# Patient Record
Sex: Female | Born: 1960 | Race: White | Hispanic: No | Marital: Married | Smoking: Current every day smoker
Health system: Southern US, Community
[De-identification: ages and names within clinical notes are randomized; demographics above are authoritative.]

## PROBLEM LIST (undated history)

## (undated) DIAGNOSIS — Z8619 Personal history of other infectious and parasitic diseases: Secondary | ICD-10-CM

## (undated) DIAGNOSIS — E559 Vitamin D deficiency, unspecified: Secondary | ICD-10-CM

## (undated) HISTORY — DX: Vitamin D deficiency, unspecified: E55.9

## (undated) HISTORY — DX: Personal history of other infectious and parasitic diseases: Z86.19

## (undated) HISTORY — PX: TUBAL LIGATION: SHX77

---

## 2005-10-31 ENCOUNTER — Ambulatory Visit: Payer: Self-pay

## 2006-01-07 ENCOUNTER — Ambulatory Visit: Payer: Self-pay

## 2007-11-04 ENCOUNTER — Ambulatory Visit: Payer: Self-pay

## 2010-06-12 LAB — HM PAP SMEAR: HM PAP: NEGATIVE

## 2010-06-21 ENCOUNTER — Ambulatory Visit: Payer: Self-pay | Admitting: Family Medicine

## 2010-07-10 ENCOUNTER — Ambulatory Visit: Payer: Self-pay | Admitting: Family Medicine

## 2013-06-30 ENCOUNTER — Ambulatory Visit: Payer: Self-pay | Admitting: Family Medicine

## 2013-07-01 LAB — HM MAMMOGRAPHY

## 2014-04-22 LAB — CBC AND DIFFERENTIAL
HCT: 43 % (ref 36–46)
Hemoglobin: 15.1 g/dL (ref 12.0–16.0)
PLATELETS: 297 10*3/uL (ref 150–399)
WBC: 13.3 10^3/mL

## 2014-07-21 LAB — LIPID PANEL
CHOLESTEROL: 138 mg/dL (ref 0–200)
HDL: 52 mg/dL (ref 35–70)
LDL Cholesterol: 70 mg/dL
Triglycerides: 82 mg/dL (ref 40–160)

## 2014-07-21 LAB — BASIC METABOLIC PANEL
BUN: 10 mg/dL (ref 4–21)
Creatinine: 0.8 mg/dL (ref 0.5–1.1)
GLUCOSE: 93 mg/dL
POTASSIUM: 3.9 mmol/L (ref 3.4–5.3)
Sodium: 141 mmol/L (ref 137–147)

## 2015-09-13 DIAGNOSIS — E559 Vitamin D deficiency, unspecified: Secondary | ICD-10-CM | POA: Insufficient documentation

## 2015-09-13 DIAGNOSIS — F17200 Nicotine dependence, unspecified, uncomplicated: Secondary | ICD-10-CM | POA: Insufficient documentation

## 2015-09-13 DIAGNOSIS — F439 Reaction to severe stress, unspecified: Secondary | ICD-10-CM | POA: Insufficient documentation

## 2015-09-13 DIAGNOSIS — M67439 Ganglion, unspecified wrist: Secondary | ICD-10-CM | POA: Insufficient documentation

## 2015-09-14 ENCOUNTER — Encounter: Payer: Self-pay | Admitting: Family Medicine

## 2015-09-14 ENCOUNTER — Ambulatory Visit (INDEPENDENT_AMBULATORY_CARE_PROVIDER_SITE_OTHER): Payer: 59 | Admitting: Family Medicine

## 2015-09-14 VITALS — BP 140/86 | HR 70 | Temp 98.2°F | Resp 16 | Ht 61.5 in | Wt 139.0 lb

## 2015-09-14 DIAGNOSIS — Z124 Encounter for screening for malignant neoplasm of cervix: Secondary | ICD-10-CM | POA: Diagnosis not present

## 2015-09-14 DIAGNOSIS — Z1239 Encounter for other screening for malignant neoplasm of breast: Secondary | ICD-10-CM | POA: Diagnosis not present

## 2015-09-14 DIAGNOSIS — E559 Vitamin D deficiency, unspecified: Secondary | ICD-10-CM | POA: Diagnosis not present

## 2015-09-14 DIAGNOSIS — R200 Anesthesia of skin: Secondary | ICD-10-CM

## 2015-09-14 DIAGNOSIS — R202 Paresthesia of skin: Secondary | ICD-10-CM

## 2015-09-14 DIAGNOSIS — Z720 Tobacco use: Secondary | ICD-10-CM

## 2015-09-14 DIAGNOSIS — F17200 Nicotine dependence, unspecified, uncomplicated: Secondary | ICD-10-CM

## 2015-09-14 DIAGNOSIS — Z01419 Encounter for gynecological examination (general) (routine) without abnormal findings: Secondary | ICD-10-CM | POA: Diagnosis not present

## 2015-09-14 DIAGNOSIS — J309 Allergic rhinitis, unspecified: Secondary | ICD-10-CM

## 2015-09-14 MED ORDER — FLUTICASONE PROPIONATE 50 MCG/ACT NA SUSP: 2.0000 | Freq: Every day | NASAL | Status: DC

## 2015-09-14 NOTE — Patient Instructions (Addendum)
Recommend taking 81mg  Enteric Coated aspirin daily   Smoking Cessation, Tips for Success If you are ready to quit smoking, congratulations! You have chosen to help yourself be healthier. Cigarettes bring nicotine, tar, carbon monoxide, and other irritants into your body. Your lungs, heart, and blood vessels will be able to work better without these poisons. There are many different ways to quit smoking. Nicotine gum, nicotine patches, a nicotine inhaler, or nicotine nasal spray can help with physical craving. Hypnosis, support groups, and medicines help break the habit of smoking. WHAT THINGS CAN I DO TO MAKE QUITTING EASIER?  Here are some tips to help you quit for good:  Pick a date when you will quit smoking completely. Tell all of your friends and family about your plan to quit on that date.  Do not try to slowly cut down on the number of cigarettes you are smoking. Pick a quit date and quit smoking completely starting on that day.  Throw away all cigarettes.   Clean and remove all ashtrays from your home, work, and car.  On a card, write down your reasons for quitting. Carry the card with you and read it when you get the urge to smoke.  Cleanse your body of nicotine. Drink enough water and fluids to keep your urine clear or pale yellow. Do this after quitting to flush the nicotine from your body.  Learn to predict your moods. Do not let a bad situation be your excuse to have a cigarette. Some situations in your life might tempt you into wanting a cigarette.  Never have "just one" cigarette. It leads to wanting another and another. Remind yourself of your decision to quit.  Change habits associated with smoking. If you smoked while driving or when feeling stressed, try other activities to replace smoking. Stand up when drinking your coffee. Brush your teeth after eating. Sit in a different chair when you read the paper. Avoid alcohol while trying to quit, and try to drink fewer  caffeinated beverages. Alcohol and caffeine may urge you to smoke.  Avoid foods and drinks that can trigger a desire to smoke, such as sugary or spicy foods and alcohol.  Ask people who smoke not to smoke around you.  Have something planned to do right after eating or having a cup of coffee. For example, plan to take a walk or exercise.  Try a relaxation exercise to calm you down and decrease your stress. Remember, you may be tense and nervous for the first 2 weeks after you quit, but this will pass.  Find new activities to keep your hands busy. Play with a pen, coin, or rubber band. Doodle or draw things on paper.  Brush your teeth right after eating. This will help cut down on the craving for the taste of tobacco after meals. You can also try mouthwash.   Use oral substitutes in place of cigarettes. Try using lemon drops, carrots, cinnamon sticks, or chewing gum. Keep them handy so they are available when you have the urge to smoke.  When you have the urge to smoke, try deep breathing.  Designate your home as a nonsmoking area.  If you are a heavy smoker, ask your health care provider about a prescription for nicotine chewing gum. It can ease your withdrawal from nicotine.  Reward yourself. Set aside the cigarette money you save and buy yourself something nice.  Look for support from others. Join a support group or smoking cessation program. Ask someone at home  or at work to help you with your plan to quit smoking.  Always ask yourself, "Do I need this cigarette or is this just a reflex?" Tell yourself, "Today, I choose not to smoke," or "I do not want to smoke." You are reminding yourself of your decision to quit.  Do not replace cigarette smoking with electronic cigarettes (commonly called e-cigarettes). The safety of e-cigarettes is unknown, and some may contain harmful chemicals.  If you relapse, do not give up! Plan ahead and think about what you will do the next time you get  the urge to smoke. HOW WILL I FEEL WHEN I QUIT SMOKING? You may have symptoms of withdrawal because your body is used to nicotine (the addictive substance in cigarettes). You may crave cigarettes, be irritable, feel very hungry, cough often, get headaches, or have difficulty concentrating. The withdrawal symptoms are only temporary. They are strongest when you first quit but will go away within 10-14 days. When withdrawal symptoms occur, stay in control. Think about your reasons for quitting. Remind yourself that these are signs that your body is healing and getting used to being without cigarettes. Remember that withdrawal symptoms are easier to treat than the major diseases that smoking can cause.  Even after the withdrawal is over, expect periodic urges to smoke. However, these cravings are generally short lived and will go away whether you smoke or not. Do not smoke! WHAT RESOURCES ARE AVAILABLE TO HELP ME QUIT SMOKING? Your health care provider can direct you to community resources or hospitals for support, which may include:  Group support.  Education.  Hypnosis.  Therapy.   This information is not intended to replace advice given to you by your health care provider. Make sure you discuss any questions you have with your health care provider.   Document Released: 06/08/2004 Document Revised: 10/01/2014 Document Reviewed: 02/26/2013 Elsevier Interactive Patient Education [employer]

## 2015-09-14 NOTE — Progress Notes (Signed)
Patient: Gina Kidd, Female    DOB: Jan 14, 1961, 54 y.o.   MRN: [id number] Visit Date: 09/14/2015  Today's Provider: Mila Merry, MD   Chief Complaint  Patient presents with  . Annual Exam  . Hyperlipidemia    follow up  . Stress    follow up  . Numbness    in right hand  . Nicotine Dependence   Subjective:    Annual physical exam Gina Kidd is a 54 y.o. female who presents today for health maintenance and complete physical. She feels poorly. She reports exercising 6-7 days a week at [employer]. She reports she is sleeping poorly.  -----------------------------------------------------------------  Lipid/Cholesterol, Follow-up:   Last seen for this1 years ago.  Management changes since that visit include no changes. . Last Lipid Panel:    Component Value Date/Time   CHOL 138 07/21/2014   TRIG 82 07/21/2014   HDL 52 07/21/2014   LDLCALC 70 07/21/2014    Risk factors for vascular disease include hypercholesterolemia  She reports poor compliance with treatment. She is not having side effects.  Current symptoms include none and have been stable. Weight trend: increasing steadily Prior visit with dietician: no Current diet: in general, an "unhealthy" diet Current exercise: cardiovascular workout on exercise equipment  Wt Readings from Last 3 Encounters:  07/20/14 130 lb (58.968 kg)    ------------------------------------------------------------------- Follow up Situational Stress:  Last office visit was 1 year ago and no changes were made.   Tobacco Abuse:  Last office visit was 1 year ago. Management at that visit includes administering pneumovax vaccine. Patient was also advised to quit smoking. Patient states she still smokes less than 1 pack per day.   Numbness in right hand:  Patient states she has had numbness in her right hand off and on for several months.   Review of Systems  Constitutional: Negative for fever, chills and  fatigue.  HENT: Positive for rhinorrhea and sinus pressure. Negative for congestion, ear pain, sneezing and sore throat.   Eyes: Negative.  Negative for pain and redness.  Respiratory: Negative for cough, shortness of breath and wheezing.   Cardiovascular: Negative for chest pain and leg swelling.  Gastrointestinal: Negative for nausea, abdominal pain, diarrhea, constipation and blood in stool.  Endocrine: Negative for polydipsia and polyphagia.  Genitourinary: Negative.  Negative for dysuria, hematuria, flank pain, vaginal bleeding, vaginal discharge and pelvic pain.  Musculoskeletal: Negative for back pain, joint swelling, arthralgias and gait problem.  Skin: Negative for rash.  Neurological: Positive for numbness (right hand). Negative for dizziness, tremors, seizures, weakness, light-headedness and headaches.  Hematological: Negative for adenopathy.  Psychiatric/Behavioral: Negative.  Negative for behavioral problems, confusion and dysphoric mood. The patient is not nervous/anxious and is not hyperactive.     Social History      She  reports that she has been smoking Cigarettes.  She has a 15 pack-year smoking history. She does not have any smokeless tobacco history on file. She reports that she does not drink alcohol or use illicit drugs.       Social History   Social History  . Marital Status: Married    Spouse Name: N/A  . Number of Children: 3  . Years of Education: N/A   Occupational History  . Medical Assistant    Social History Main Topics  . Smoking status: Current Every Day Smoker -- 0.75 packs/day for 20 years    Types: Cigarettes  . Smokeless tobacco: None  .  Alcohol Use: No  . Drug Use: No  . Sexual Activity: Not Asked   Other Topics Concern  . None   Social History Narrative    Patient Active Problem List   Diagnosis Date Noted  . Current smoker 09/13/2015  . Ganglion cyst of wrist 09/13/2015  . Situational stress 09/13/2015  . Vitamin D deficiency  09/13/2015    Past Surgical History  Procedure Laterality Date  . Cesarean section  [phone]  . Tubal ligation      Family History        Family Status  Relation Status Death Age  . Mother Alive     DDD  . Father Deceased 44's  . Sister Alive   . Brother Alive   . Brother Alive   . Sister Alive   . Sister Alive         Her family history includes CAD in her father; COPD in her sister; Congestive Heart Failure in her father; Diabetes in her brother, father, and sister; Hypertension in her brother and father.    No Known Allergies  Previous Medications   MULTIPLE VITAMINS-MINERALS (CENTRUM SILVER PO)    Take 1 tablet by mouth daily.    Patient Care Team: Malva Limes, MD as PCP - General (Family Medicine)     Objective:   Vitals: BP 140/86 mmHg  Pulse 70  Temp(Src) 98.2 F (36.8 C) (Oral)  Resp 16  Ht 5' 1.5" (1.562 m)  Wt 139 lb (63.05 kg)  BMI 25.84 kg/m2  SpO2 98%   Physical Exam   General Appearance:    Alert, cooperative, no distress, appears stated age  Head:    Normocephalic, without obvious abnormality, atraumatic  Eyes:    PERRL, conjunctiva/corneas clear, EOM's intact, fundi    benign, both eyes  Ears:    Normal TM's and external ear canals, both ears  Nose:   Mild nasal congestion and clear discharge. Nares normal, septum midline  or sinus tenderness  Throat:   Lips, mucosa, and tongue normal; teeth and gums normal  Neck:   Supple, symmetrical, trachea midline, no adenopathy;    thyroid:  no enlargement/tenderness/nodules; no carotid   bruit or JVD  Back:     Symmetric, no curvature, ROM normal, no CVA tenderness  Lungs:     Clear to auscultation bilaterally, respirations unlabored  Chest Wall:    No tenderness or deformity   Heart:    Regular rate and rhythm, S1 and S2 normal, no murmur, rub   or gallop  Breast Exam:    normal appearance, no masses or tenderness  Abdomen:     Soft, non-tender, bowel sounds active all four quadrants,      no masses, no organomegaly  Pelvic:    cervix normal in appearance and external genitalia normal  Extremities:   Extremities normal, atraumatic, no cyanosis or edema  Pulses:   2+ and symmetric all extremities  Skin:   Skin color, texture, turgor normal, no rashes or lesions  Lymph nodes:   Cervical, supraclavicular, and axillary nodes normal  Neurologic:   CNII-XII intact, normal strength, sensation and reflexes    throughout     Depression Screen PHQ 2/9 Scores 09/14/2015  PHQ - 2 Score 1  PHQ- 9 Score 4      Assessment & Plan:     Routine Health Maintenance and Physical Exam  Exercise Activities and Dietary recommendations Goals    None      Immunization History  Administered Date(s) Administered  . Pneumococcal Polysaccharide-23 07/20/2014    Health Maintenance  Topic Date Due  . HIV Screening  09/23/1975  . TETANUS/TDAP  09/23/1979  . COLONOSCOPY  09/22/2010  . PAP SMEAR  06/12/2013  . MAMMOGRAM  07/02/2015  . INFLUENZA VACCINE  12/23/2015 (Originally 04/25/2015)  . Hepatitis C Screening  Completed      Discussed health benefits of physical activity, and encouraged her to engage in regular exercise appropriate for her age and condition.    -------------------------------------------------------------------- 1. Well woman exam with routine gynecological exam Generally doing well.  - Comprehensive metabolic panel - Lipid panel  2. Vitamin D deficiency  - VITAMIN D 25 Hydroxy (Vit-D Deficiency, Fractures)  3. Current smoker Strongly encouraged to quit smoking  4. Allergic rhinitis, unspecified allergic rhinitis type  - fluticasone (FLONASE) 50 MCG/ACT nasal spray; Place 2 sprays into both nostrils daily.  Dispense: 16 g; Refill: 6  5. 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 Encouraged use of CTS wrist brace.   6. Screening for cervical cancer  - Pap IG (Image Guided)  7. Screening for breast cancer  - MM DIGITAL SCREENING  BILATERAL; Future

## 2015-09-15 LAB — COMPREHENSIVE METABOLIC PANEL
ALBUMIN: 4.4 g/dL (ref 3.5–5.5)
ALT: 14 IU/L (ref 0–32)
AST: 19 IU/L (ref 0–40)
Albumin/Globulin Ratio: 2.3 (ref 1.1–2.5)
Alkaline Phosphatase: 104 IU/L (ref 39–117)
BUN / CREAT RATIO: 13 (ref 9–23)
BUN: 9 mg/dL (ref 6–24)
Bilirubin Total: 0.5 mg/dL (ref 0.0–1.2)
CALCIUM: 9.4 mg/dL (ref 8.7–10.2)
CO2: 23 mmol/L (ref 18–29)
CREATININE: 0.7 mg/dL (ref 0.57–1.00)
Chloride: 105 mmol/L (ref 96–106)
GFR calc Af Amer: 114 mL/min/{1.73_m2} (ref >59)
GFR, EST NON AFRICAN AMERICAN: 99 mL/min/{1.73_m2} (ref >59)
GLOBULIN, TOTAL: 1.9 g/dL (ref 1.5–4.5)
Glucose: 98 mg/dL (ref 65–99)
Potassium: 4.4 mmol/L (ref 3.5–5.2)
SODIUM: 141 mmol/L (ref 134–144)
Total Protein: 6.3 g/dL (ref 6.0–8.5)

## 2015-09-15 LAB — LIPID PANEL
CHOL/HDL RATIO: 3 ratio (ref 0.0–4.4)
Cholesterol, Total: 149 mg/dL (ref 100–199)
HDL: 49 mg/dL (ref >39)
LDL CALC: 79 mg/dL (ref 0–99)
TRIGLYCERIDES: 105 mg/dL (ref 0–149)
VLDL CHOLESTEROL CAL: 21 mg/dL (ref 5–40)

## 2015-09-15 LAB — VITAMIN D 25 HYDROXY (VIT D DEFICIENCY, FRACTURES): Vit D, 25-Hydroxy: 21.3 ng/mL — ABNORMAL LOW (ref 30.0–100.0)

## 2015-09-20 LAB — PAP IG (IMAGE GUIDED): PAP Smear Comment: 0

## 2015-10-26 ENCOUNTER — Telehealth: Payer: Self-pay

## 2015-10-26 DIAGNOSIS — F439 Reaction to severe stress, unspecified: Secondary | ICD-10-CM

## 2015-10-26 MED ORDER — SERTRALINE HCL 25 MG PO TABS: ORAL_TABLET | ORAL | Status: DC

## 2015-10-26 NOTE — Telephone Encounter (Signed)
Patient called stating her Depression is worsening. She states when she is driving in her car to go to work she verbally  out loud says to her self " I dont belong here". Patient states she feels worthless. She states that she is not suicidal bet doest feel that she belongs in her marriage or in the state of [place]. She states her and her husband are not intimate and only have sex once a year. Patient states she was seen 2 months ago by Dr. Sherrie Mustache and didn't think she needed anything for Depression, but now feels like she does. Patient has taken Wellbutrin in the past and didn't tolerate it. She says it made her feel like a Zombie. I consulted with Dr. Sherrie Mustache about patient and he says we will call something in for her and she is to follow up in 2 weeks. Patient advised and agrees to start medication and follow up in 2 weeks. Patient will call back to schedule follow up appointment.

## 2016-02-06 ENCOUNTER — Encounter: Payer: Self-pay | Admitting: Physician Assistant

## 2016-02-06 ENCOUNTER — Ambulatory Visit (INDEPENDENT_AMBULATORY_CARE_PROVIDER_SITE_OTHER): Payer: 59 | Admitting: Physician Assistant

## 2016-02-06 VITALS — BP 118/70 | HR 88 | Temp 98.1°F | Resp 16 | Wt 139.6 lb

## 2016-02-06 DIAGNOSIS — H6123 Impacted cerumen, bilateral: Secondary | ICD-10-CM

## 2016-02-06 NOTE — Patient Instructions (Addendum)
Cerumen Impaction The structures of the external ear canal secrete a waxy substance known as cerumen. Excess cerumen can build up in the ear canal, causing a condition known as cerumen impaction. Cerumen impaction can cause ear pain and disrupt the function of the ear. The rate of cerumen production differs for each individual. In certain individuals, the configuration of the ear canal may decrease his or her ability to naturally remove cerumen. CAUSES Cerumen impaction is caused by excessive cerumen production or buildup. RISK FACTORS  Frequent use of swabs to clean ears.  Having narrow ear canals.  Having eczema.  Being dehydrated. SIGNS AND SYMPTOMS  Diminished hearing.  Ear drainage.  Ear pain.  Ear itch. TREATMENT Treatment may involve:  Over-the-counter or prescription ear drops to soften the cerumen.  Removal of cerumen by a health care provider. This may be done with:  Irrigation with warm water. This is the most common method of removal.  Ear curettes and other instruments.  Surgery. This may be done in severe cases. HOME CARE INSTRUCTIONS  Take medicines only as directed by your health care provider.  Do not insert objects into the ear with the intent of cleaning the ear. PREVENTION  Do not insert objects into the ear, even with the intent of cleaning the ear. Removing cerumen as a part of normal hygiene is not necessary, and the use of swabs in the ear canal is not recommended.  Drink enough water to keep your urine clear or pale yellow.  Control your eczema if you have it. SEEK MEDICAL CARE IF:  You develop ear pain.  You develop bleeding from the ear.  The cerumen does not clear after you use ear drops as directed.   This information is not intended to replace advice given to you by your health care provider. Make sure you discuss any questions you have with your health care provider.   Document Released: 10/18/2004 Document Revised: 10/01/2014  Document Reviewed: 04/27/2015 Elsevier Interactive Patient Education 2016 [employer] Inc[employer]  

## 2016-02-06 NOTE — Progress Notes (Signed)
Patient: Gina Kidd Female    DOB: 1961/04/28   55 y.o.   MRN: [id number] Visit Date: 02/06/2016  Today's Provider: Margaretann Loveless, PA-C   Chief Complaint  Patient presents with  . Ear Fullness   Subjective:    Ear Fullness  There is pain in the left (ear fullness and pulsating tinnitus) ear. This is a new problem. The current episode started 1 to 4 weeks ago (2 weeks). The problem occurs constantly (especially when she lies down at night). The problem has been unchanged. There has been no fever. The patient is experiencing no pain. Pertinent negatives include no abdominal pain, coughing, ear discharge, headaches, hearing loss, neck pain, rhinorrhea, sore throat or vomiting. She has tried ear drops (ear ache and peroxide) for the symptoms. The treatment provided no relief. There is no history of a chronic ear infection, hearing loss or a tympanostomy tube.   Note: patient discontinue the Sertraline more than 30 days ago due to side effect of diarrhea.    No Known Allergies Previous Medications   FLUTICASONE (FLONASE) 50 MCG/ACT NASAL SPRAY    Place 2 sprays into both nostrils daily.   MULTIPLE VITAMINS-MINERALS (CENTRUM SILVER PO)    Take 1 tablet by mouth daily.    Review of Systems  Constitutional: Negative.   HENT: Positive for tinnitus (pulsatile tinnitus in left ear). Negative for ear discharge, ear pain (ear fullness), hearing loss, rhinorrhea and sore throat.   Respiratory: Negative for cough, chest tightness and shortness of breath.   Cardiovascular: Negative for chest pain, palpitations and leg swelling.  Gastrointestinal: Negative for nausea, vomiting and abdominal pain.  Musculoskeletal: Negative for neck pain.  Neurological: Negative for dizziness and headaches.    Social History  Substance Use Topics  . Smoking status: Current Every Day Smoker -- 0.75 packs/day for 20 years    Types: Cigarettes  . Smokeless tobacco: Not on file  . Alcohol Use: No     Objective:   BP 118/70 mmHg  Pulse 88  Temp(Src) 98.1 F (36.7 C) (Oral)  Resp 16  Wt 139 lb 9.6 oz (63.322 kg)  Physical Exam  Constitutional: She appears well-developed and well-nourished. No distress.  HENT:  Head: Normocephalic and atraumatic.  Right Ear: Hearing, external ear and ear canal normal. Tympanic membrane is not erythematous and not bulging. A middle ear effusion is present.  Left Ear: Hearing, external ear and ear canal normal. Tympanic membrane is not erythematous and not bulging. A middle ear effusion is present.  Nose: Mucosal edema present. No rhinorrhea. Right sinus exhibits no maxillary sinus tenderness and no frontal sinus tenderness. Left sinus exhibits no maxillary sinus tenderness and no frontal sinus tenderness.  Mouth/Throat: Uvula is midline, oropharynx is clear and moist and mucous membranes are normal. No oropharyngeal exudate, posterior oropharyngeal edema or posterior oropharyngeal erythema.  Initially cerumen impaction noted bilaterally. Ear lavage bilaterally was successful  Eyes: Conjunctivae are normal. Pupils are equal, round, and reactive to light. Right eye exhibits no discharge. Left eye exhibits no discharge. No scleral icterus.  Neck: Normal range of motion. Neck supple. Carotid bruit is not present. No tracheal deviation present. No thyromegaly present.  Cardiovascular: Normal rate, regular rhythm and normal heart sounds.  Exam reveals no gallop and no friction rub.   No murmur heard. Pulmonary/Chest: Effort normal and breath sounds normal. No stridor. No respiratory distress. She has no wheezes. She has no rales.  Lymphadenopathy:    She  has no cervical adenopathy.  Skin: Skin is warm and dry. She is not diaphoretic.  Vitals reviewed.       Assessment & Plan:     1. Cerumen impaction, bilateral She had been using q-tips to clean her ears and developed a pulsating tinnitus and ear fullness. After successful ear lavage she stated all  symptoms were gone. Advised to not use q-tips in her ears, add debrox drops like once per week and to get the ear wax removal tools that are sold on the same aisle as debrox. She agrees and will call if symptoms worsen. - Ear Lavage       Margaretann Loveless, PA-C  [facility]

## 2017-08-08 ENCOUNTER — Ambulatory Visit
Admission: EM | Admit: 2017-08-08 | Discharge: 2017-08-08 | Disposition: A | Discharge status: Home / Self Care | Payer: 59 | Attending: Family Medicine | Admitting: Family Medicine

## 2017-08-08 ENCOUNTER — Other Ambulatory Visit: Payer: Self-pay

## 2017-08-08 ENCOUNTER — Encounter: Payer: Self-pay | Admitting: Emergency Medicine

## 2017-08-08 DIAGNOSIS — R2232 Localized swelling, mass and lump, left upper limb: Secondary | ICD-10-CM

## 2017-08-08 DIAGNOSIS — M25542 Pain in joints of left hand: Secondary | ICD-10-CM | POA: Diagnosis not present

## 2017-08-08 MED ORDER — MELOXICAM 15 MG PO TABS: 15.0000 mg | ORAL_TABLET | Freq: Every day | ORAL | 0 refills | Status: DC | PRN

## 2017-08-08 NOTE — Discharge Instructions (Signed)
You need to see Rheumatology.  Use the medication daily as needed.  Take care  Dr. Adriana Simas

## 2017-08-08 NOTE — ED Provider Notes (Signed)
[facility]    CSN: [id number] Arrival date & time: 08/08/17  1314  History   Chief Complaint Chief Complaint  Patient presents with  . hand swelling    left   HPI  56 year old female presents with the above complaint.  Patient reports a one-week history of swelling of the left hand. Swelling is predominantly of the second and third MCP joints. She reports swelling, redness, pain. Pain is currently mild. She reports decreased range of motion. She's taken ibuprofen without significant improvement. No known inciting factor. No family history of rheumatologic disease. No known relieving factors. No other associated symptoms. No other complaints  Of note, patient does not endorse rheumatologic history. However, she states that she is "double jointed".  Past Medical History:  Diagnosis Date  . History of chicken pox   . History of mumps   . Vitamin D deficiency    Patient Active Problem List   Diagnosis Date Noted  . Current smoker 09/13/2015  . Ganglion cyst of wrist 09/13/2015  . Situational stress 09/13/2015  . Vitamin D deficiency 09/13/2015   Past Surgical History:  Procedure Laterality Date  . CESAREAN SECTION  [phone]  . TUBAL LIGATION     OB History    Gravida Para Term Preterm AB Living   3 3           SAB TAB Ectopic Multiple Live Births                 Home Medications    Prior to Admission medications   Medication Sig Start Date End Date Taking? Authorizing Provider  meloxicam (MOBIC) 15 MG tablet Take 1 tablet (15 mg total) daily as needed by mouth for pain. 08/08/17   Tommie Sams, DO    Family History Family History  Problem Relation Age of Onset  . Hypertension Father   . CAD Father   . Congestive Heart Failure Father   . Diabetes Father   . Diabetes Brother   . Hypertension Brother   . Diabetes Sister   . COPD Sister     Social History Social History   Tobacco Use  . Smoking status: Current Every Day Smoker   Packs/day: 0.75    Years: 20.00    Pack years: 15.00    Types: Cigarettes  . Smokeless tobacco: Never Used  Substance Use Topics  . Alcohol use: No    Alcohol/week: 0.0 oz  . Drug use: No    Allergies   Patient has no known allergies.   Review of Systems Review of Systems  Musculoskeletal: Positive for joint swelling.       Joint pain, decreased ROM.  All other systems reviewed and are negative.  Physical Exam Triage Vital Signs ED Triage Vitals [08/08/17 1325]  Enc Vitals Group     BP 133/83     Pulse Rate 85     Resp 16     Temp 98.7 F (37.1 C)     Temp Source Oral     SpO2 100 %     Weight 130 lb (59 kg)     Height 5\' 2"  (1.575 m)     Head Circumference      Peak Flow      Pain Score 1     Pain Loc      Pain Edu?      Excl. in GC?    Updated Vital Signs BP 133/83 (BP Location: Right Arm)   Pulse  85   Temp 98.7 F (37.1 C) (Oral)   Resp 16   Ht 5\' 2"  (1.575 m)   Wt 130 lb (59 kg)   SpO2 100%   BMI 23.78 kg/m   Physical Exam  Constitutional: She is oriented to person, place, and time. She appears well-developed. No distress.  HENT:  Head: Normocephalic and atraumatic.  Nose: Nose normal.  Eyes: Conjunctivae are normal. No scleral icterus.  Neck: Neck supple. No tracheal deviation present.  Cardiovascular: Normal rate and regular rhythm.  No murmur heard. Pulmonary/Chest: Effort normal and breath sounds normal. No respiratory distress.  Abdominal: Soft. She exhibits no distension. There is no tenderness.  Musculoskeletal:  Left hand - swelling of the second and third MCP joints noted. 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  Neurological: She is alert and oriented to person, place, and time. She exhibits normal muscle tone.  Skin: Skin is warm. No rash noted.  Psychiatric: She has a normal mood and affect. Her behavior is normal.  Vitals reviewed.  UC Treatments / Results  Labs (all labs ordered are listed, but only abnormal  results are displayed) Labs Reviewed - No data to display  EKG  EKG Interpretation None       Radiology No results found.  Procedures Procedures (including critical care time)  Medications Ordered in UC Medications - No data to display   Initial Impression / Assessment and Plan / UC Course  I have reviewed the triage vital signs and the nursing notes.  Pertinent labs & imaging results that were available during my care of the patient were reviewed by me and considered in my medical decision making (see chart for details).     56 year old female presents with hand pain/swelling. Given exam findings, I'm concerned about rheumatoid arthritis. Starting on meloxicam. Advised to follow-up with primary for referral to rheumatology.  Final Clinical Impressions(s) / UC Diagnoses   Final diagnoses:  Arthralgia of left hand   ED Discharge Orders        Ordered    meloxicam (MOBIC) 15 MG tablet  Daily PRN     08/08/17 1353     Controlled Substance Prescriptions [place] Controlled Substance Registry consulted? Not Applicable   08/10/17, DO 08/08/17 1439

## 2017-08-08 NOTE — ED Triage Notes (Signed)
Patient in today c/o 1 weeks history of left hand swelling and pain. Swelling mostly over the knuckle of the middle finger. Patient states it is hard to bend her finger. Patient denies injury.

## 2017-10-07 DIAGNOSIS — M79642 Pain in left hand: Secondary | ICD-10-CM | POA: Diagnosis not present

## 2017-10-08 DIAGNOSIS — Z1321 Encounter for screening for nutritional disorder: Secondary | ICD-10-CM | POA: Diagnosis not present

## 2017-10-08 DIAGNOSIS — M069 Rheumatoid arthritis, unspecified: Secondary | ICD-10-CM | POA: Diagnosis not present

## 2017-10-08 DIAGNOSIS — Z131 Encounter for screening for diabetes mellitus: Secondary | ICD-10-CM | POA: Diagnosis not present

## 2017-10-08 DIAGNOSIS — Z13220 Encounter for screening for lipoid disorders: Secondary | ICD-10-CM | POA: Diagnosis not present

## 2018-02-14 ENCOUNTER — Ambulatory Visit
Admission: EM | Admit: 2018-02-14 | Discharge: 2018-02-14 | Disposition: A | Discharge status: Home / Self Care | Payer: 59 | Attending: Family Medicine | Admitting: Family Medicine

## 2018-02-14 ENCOUNTER — Other Ambulatory Visit: Payer: Self-pay

## 2018-02-14 DIAGNOSIS — M79644 Pain in right finger(s): Secondary | ICD-10-CM

## 2018-02-14 NOTE — ED Provider Notes (Signed)
[facility]    CSN: [id number] Arrival date & time: 02/14/18  1650  History   Chief Complaint Chief Complaint  Patient presents with  . Thumb Injury   HPI  57 year old female presents with a thumb injury.  Patient states that 1.5 weeks ago, she smashed her right thumb in a car door.  She complains of swelling and pain particularly at the IP joint and distal.  She is tried ice, ibuprofen, naproxen without significant resolution.  She reports continued pressure of the digit.  Patient states that she is able to move it.  No other reported symptoms.  No other complaints.  Past Medical History:  Diagnosis Date  . History of chicken pox   . History of mumps   . Vitamin D deficiency     Patient Active Problem List   Diagnosis Date Noted  . Current smoker 09/13/2015  . Ganglion cyst of wrist 09/13/2015  . Situational stress 09/13/2015  . Vitamin D deficiency 09/13/2015    Past Surgical History:  Procedure Laterality Date  . CESAREAN SECTION  [phone]  . TUBAL LIGATION      OB History    Gravida  3   Para  3   Term      Preterm      AB      Living        SAB      TAB      Ectopic      Multiple      Live Births               Home Medications    Prior to Admission medications   Medication Sig Start Date End Date Taking? Authorizing Provider  meloxicam (MOBIC) 15 MG tablet Take 1 tablet (15 mg total) daily as needed by mouth for pain. 08/08/17  Yes Tommie Sams, DO    Family History Family History  Problem Relation Age of Onset  . Hypertension Father   . CAD Father   . Congestive Heart Failure Father   . Diabetes Father   . Diabetes Brother   . Hypertension Brother   . Diabetes Sister   . COPD Sister     Social History Social History   Tobacco Use  . Smoking status: Current Every Day Smoker    Packs/day: 1.00    Years: 20.00    Pack years: 20.00    Types: Cigarettes  . Smokeless tobacco: Never Used  Substance  Use Topics  . Alcohol use: No    Alcohol/week: 0.0 oz  . Drug use: No     Allergies   Patient has no known allergies.   Review of Systems Review of Systems  Constitutional: Negative.   Musculoskeletal:       Right thumb pain.   Physical Exam Triage Vital Signs ED Triage Vitals  Enc Vitals Group     BP 02/14/18 1705 (!) 150/91     Pulse Rate 02/14/18 1705 79     Resp 02/14/18 1705 16     Temp 02/14/18 1705 99.3 F (37.4 C)     Temp Source 02/14/18 1705 Oral     SpO2 02/14/18 1705 96 %     Weight 02/14/18 1708 130 lb (59 kg)     Height 02/14/18 1708 5\' 2"  (1.575 m)     Head Circumference --      Peak Flow --      Pain Score 02/14/18 1708 5     Pain  Loc --      Pain Edu? --      Excl. in GC? --    Updated Vital Signs BP (!) 150/91 (BP Location: Left Arm)   Pulse 79   Temp 99.3 F (37.4 C) (Oral)   Resp 16   Ht 5\' 2"  (1.575 m)   Wt 130 lb (59 kg)   SpO2 96%   BMI 23.78 kg/m   Physical Exam  Constitutional: She is oriented to person, place, and time. She appears well-developed. No distress.  HENT:  Head: Normocephalic and atraumatic.  Pulmonary/Chest: Effort normal. No respiratory distress.  Musculoskeletal:  Patient with tenderness of the IP joint of the right thumb. Subungual hematoma noted.  Neurological: She is alert and oriented to person, place, and time.  Psychiatric: She has a normal mood and affect. Her behavior is normal.  Nursing note and vitals reviewed.  UC Treatments / Results  Labs (all labs ordered are listed, but only abnormal results are displayed) Labs Reviewed - No data to display  EKG None  Radiology No results found.  Procedures Procedures (including critical care time) Cautery used to puncture the nail and relieve blood from underneath the nail.  Medications Ordered in UC Medications - No data to display  Initial Impression / Assessment and Plan / UC Course  I have reviewed the triage vital signs and the nursing  notes.  Pertinent labs & imaging results that were available during my care of the patient were reviewed by me and considered in my medical decision making (see chart for details).    57 year old female presents with right thumb pain.  Declines x-ray.  Advised rest, ice, elevation.  Cautery used today to allow drainage of subungual hematoma.  Final Clinical Impressions(s) / UC Diagnoses   Final diagnoses:  Thumb pain, right     Discharge Instructions     NSAID's as needed.  Rest, ice, elevate.  Take care  Dr. 58    ED Prescriptions    None     Controlled Substance Prescriptions [place] Controlled Substance Registry consulted? Not Applicable   Adriana Simas, DO 02/14/18 1827

## 2018-02-14 NOTE — Discharge Instructions (Signed)
NSAID's as needed.  Rest, ice, elevate.  Take care  Dr. Adriana Simas

## 2018-02-14 NOTE — ED Triage Notes (Signed)
Pt states she smashed her thumb in a car door about 1.5 weeks ago. Is c/o swelling and throbbing pain. She has tried ice, IBU, naproxen, with mid relief.

## 2018-03-06 ENCOUNTER — Encounter: Payer: Self-pay | Admitting: Family Medicine

## 2018-03-06 ENCOUNTER — Ambulatory Visit: Payer: 59 | Admitting: Family Medicine

## 2018-03-06 ENCOUNTER — Other Ambulatory Visit: Payer: Self-pay | Admitting: Family Medicine

## 2018-03-06 VITALS — BP 112/68 | HR 83 | Temp 98.8°F | Resp 16 | Wt 132.0 lb

## 2018-03-06 DIAGNOSIS — N951 Menopausal and female climacteric states: Secondary | ICD-10-CM | POA: Diagnosis not present

## 2018-03-06 DIAGNOSIS — S60111A Contusion of right thumb with damage to nail, initial encounter: Secondary | ICD-10-CM

## 2018-03-06 DIAGNOSIS — M069 Rheumatoid arthritis, unspecified: Secondary | ICD-10-CM

## 2018-03-06 DIAGNOSIS — Z1231 Encounter for screening mammogram for malignant neoplasm of breast: Secondary | ICD-10-CM

## 2018-03-06 MED ORDER — CONJ ESTROG-MEDROXYPROGEST ACE 0.45-1.5 MG PO TABS: 1.0000 | ORAL_TABLET | Freq: Every day | ORAL | 3 refills | Status: DC

## 2018-03-06 NOTE — Progress Notes (Signed)
Patient: Gina Kidd Female    DOB: 08/07/1961   56 y.o.   MRN: [id number] Visit Date: 03/06/2018  Today's Provider: Mila Merry, MD   Chief Complaint  Patient presents with  . Hand Pain   Subjective:    HPI Hand pain: Patient comes in today complaining of joint pain and swelling in her left hand for several months.  She has been taking Ibuprofen to help with pain with no relief. Patient states she was seen by an Urgent Care in [place] a few months ago and was told she has Rheumatoid arthritis due to crooked left 4th finger.       Follow up ER visit  Patient was seen in ER for right thumb pain on 02/14/2018. She was treated for right thumb pain. Treatment for this included; patient had smashed right hand in car door. Patient declined x-ray. Cautery used to allow drainage of subungual hematoma. Recommended NSAID's as needed. She reports good compliance with treatment. She reports this condition is Improved.  ------------------------------------------------------------------------------------  She also requests medication to help with hormones. States she gets irritated and anxious easily. Occasional hot flashes. Last period was years ago.   No Known Allergies   Current Outpatient Medications:  .  meloxicam (MOBIC) 15 MG tablet, Take 1 tablet (15 mg total) daily as needed by mouth for pain., Disp: 30 tablet, Rfl: 0  Review of Systems  Constitutional: Negative for appetite change and chills.  Respiratory: Negative for chest tightness and shortness of breath.   Cardiovascular: Negative for chest pain and palpitations.  Gastrointestinal: Negative for abdominal pain, nausea and vomiting.  Musculoskeletal: Positive for arthralgias (left hand).  Neurological: Negative for dizziness and weakness.    Social History   Tobacco Use  . Smoking status: Current Every Day Smoker    Packs/day: 1.00    Years: 20.00    Pack years: 20.00    Types: Cigarettes  . Smokeless  tobacco: Never Used  Substance Use Topics  . Alcohol use: No    Alcohol/week: 0.0 oz   Objective:   BP 112/68 (BP Location: Right Arm, Patient Position: Sitting, Cuff Size: Normal)   Pulse 83   Temp 98.8 F (37.1 C) (Oral)   Resp 16   Wt 132 lb (59.9 kg)   SpO2 97% Comment: room air  BMI 24.14 kg/m  Vitals:   03/06/18 0951  BP: 112/68  Pulse: 83  Resp: 16  Temp: 98.8 F (37.1 C)  TempSrc: Oral  SpO2: 97%  Weight: 132 lb (59.9 kg)     Physical Exam   General Appearance:    Alert, cooperative, no distress  Eyes:    PERRL, conjunctiva/corneas clear, EOM's intact       Lungs:     Clear to auscultation bilaterally, respirations unlabored  Heart:    Regular rate and rhythm  MS:   Mild swelling of Ips of both hands with swan neck deformity of left 4th finger. Contusion of right thumb, mildly swollen, but no other deformities.           Assessment & Plan:     1. Contusion of right thumb with damage to nail, initial encounter [facility] appropratedly  2. Rheumatoid arthritis involving both hands, unspecified rheumatoid factor presence (HCC) (suspected based on exam) - Ambulatory referral to Rheumatology  3. Menopausal symptom counseled on potential increase risk of breast cancer and cardiac events with HRT. Edvised low dose daily ECASA may be beneficial and she needs  to stay up to date on breast cancer screening. Given contact information for [facility] to schedule mammogram. Strongly encouraged to stop smoking.  - estrogen, conjugated,-medroxyprogesterone (PREMPRO) 0.45-1.5 MG tablet; Take 1 tablet by mouth daily.  Dispense: 30 tablet; Refill: 3       Mila Merry, MD  [facility]

## 2018-03-06 NOTE — Patient Instructions (Signed)
   Recommend taking 81mg  enteric coated aspirin to reduce risk of vascular events such as heart attacks and strokes.    Please call the [facility] [phone]) to schedule a routine screening mammogram.   Please stop smoking

## 2018-03-28 ENCOUNTER — Ambulatory Visit
Admission: RE | Admit: 2018-03-28 | Discharge: 2018-03-28 | Disposition: A | Discharge status: Home / Self Care | Payer: 59 | Source: Ambulatory Visit | Attending: Family Medicine | Admitting: Family Medicine

## 2018-03-28 DIAGNOSIS — Z1231 Encounter for screening mammogram for malignant neoplasm of breast: Secondary | ICD-10-CM | POA: Diagnosis present

## 2018-03-31 ENCOUNTER — Other Ambulatory Visit: Payer: Self-pay | Admitting: Family Medicine

## 2018-03-31 DIAGNOSIS — R928 Other abnormal and inconclusive findings on diagnostic imaging of breast: Secondary | ICD-10-CM

## 2018-03-31 DIAGNOSIS — N6310 Unspecified lump in the right breast, unspecified quadrant: Secondary | ICD-10-CM

## 2018-04-04 ENCOUNTER — Ambulatory Visit
Admission: RE | Admit: 2018-04-04 | Discharge: 2018-04-04 | Disposition: A | Discharge status: Home / Self Care | Payer: 59 | Source: Ambulatory Visit | Attending: Family Medicine | Admitting: Family Medicine

## 2018-04-04 DIAGNOSIS — R928 Other abnormal and inconclusive findings on diagnostic imaging of breast: Secondary | ICD-10-CM | POA: Diagnosis present

## 2018-04-04 DIAGNOSIS — N6310 Unspecified lump in the right breast, unspecified quadrant: Secondary | ICD-10-CM | POA: Insufficient documentation

## 2018-04-04 DIAGNOSIS — N6312 Unspecified lump in the right breast, upper inner quadrant: Secondary | ICD-10-CM | POA: Diagnosis not present

## 2018-07-15 IMAGING — MG MM DIGITAL DIAGNOSTIC UNILAT*R* W/ TOMO W/ CAD
4 series · 4 of 12 positions shown · non-contrast
Comparison: Previous exam(s).

CLINICAL DATA: Patient was called back from screening mammogram for
a right breast mass.

EXAM:
DIGITAL DIAGNOSTIC RIGHT MAMMOGRAM WITH TOMO
ULTRASOUND RIGHT BREAST

[R CC synth-2D]
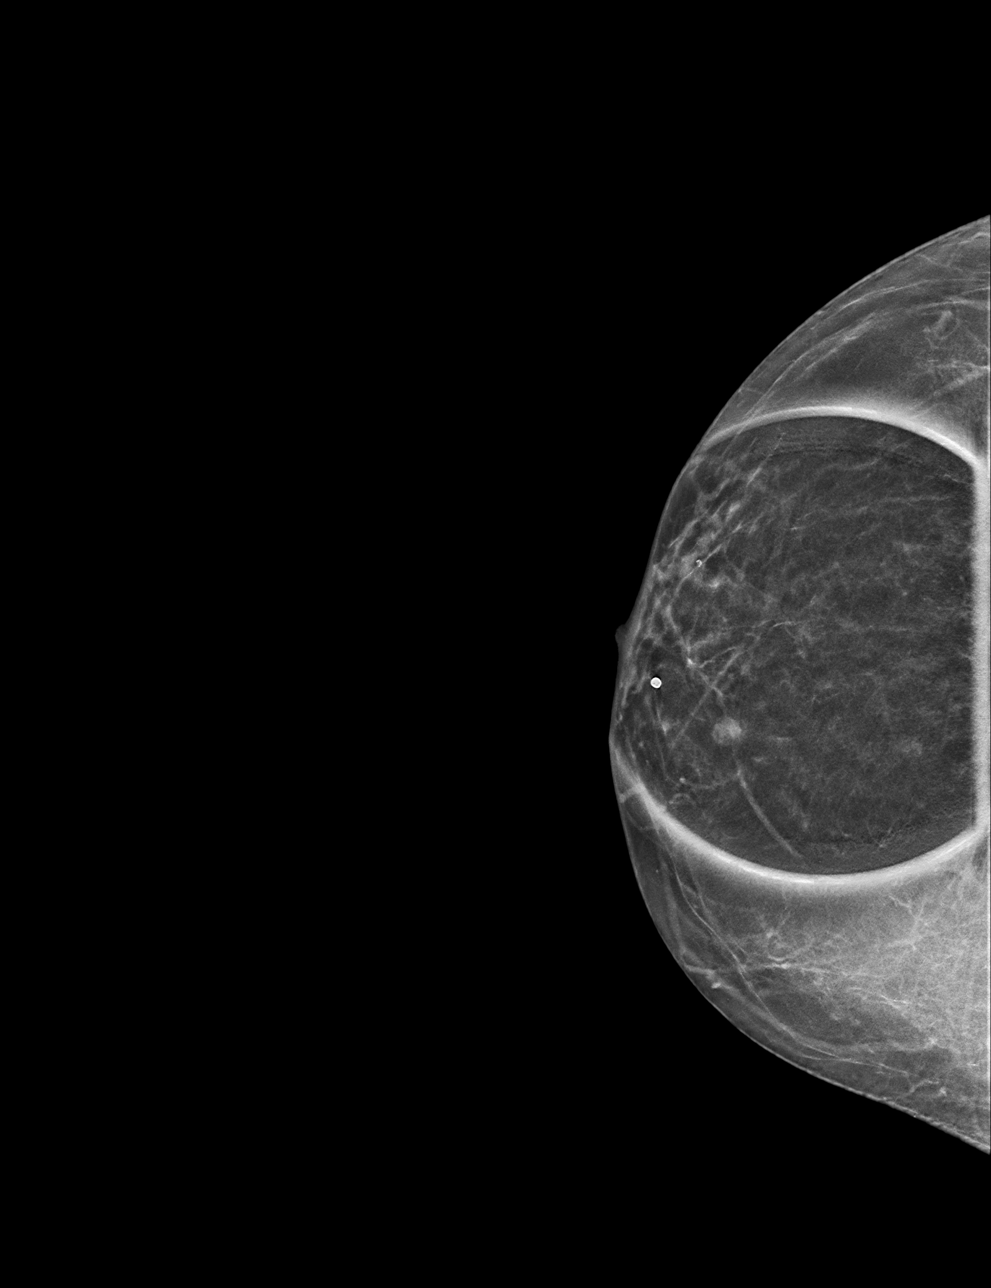

[R MLO synth-2D]
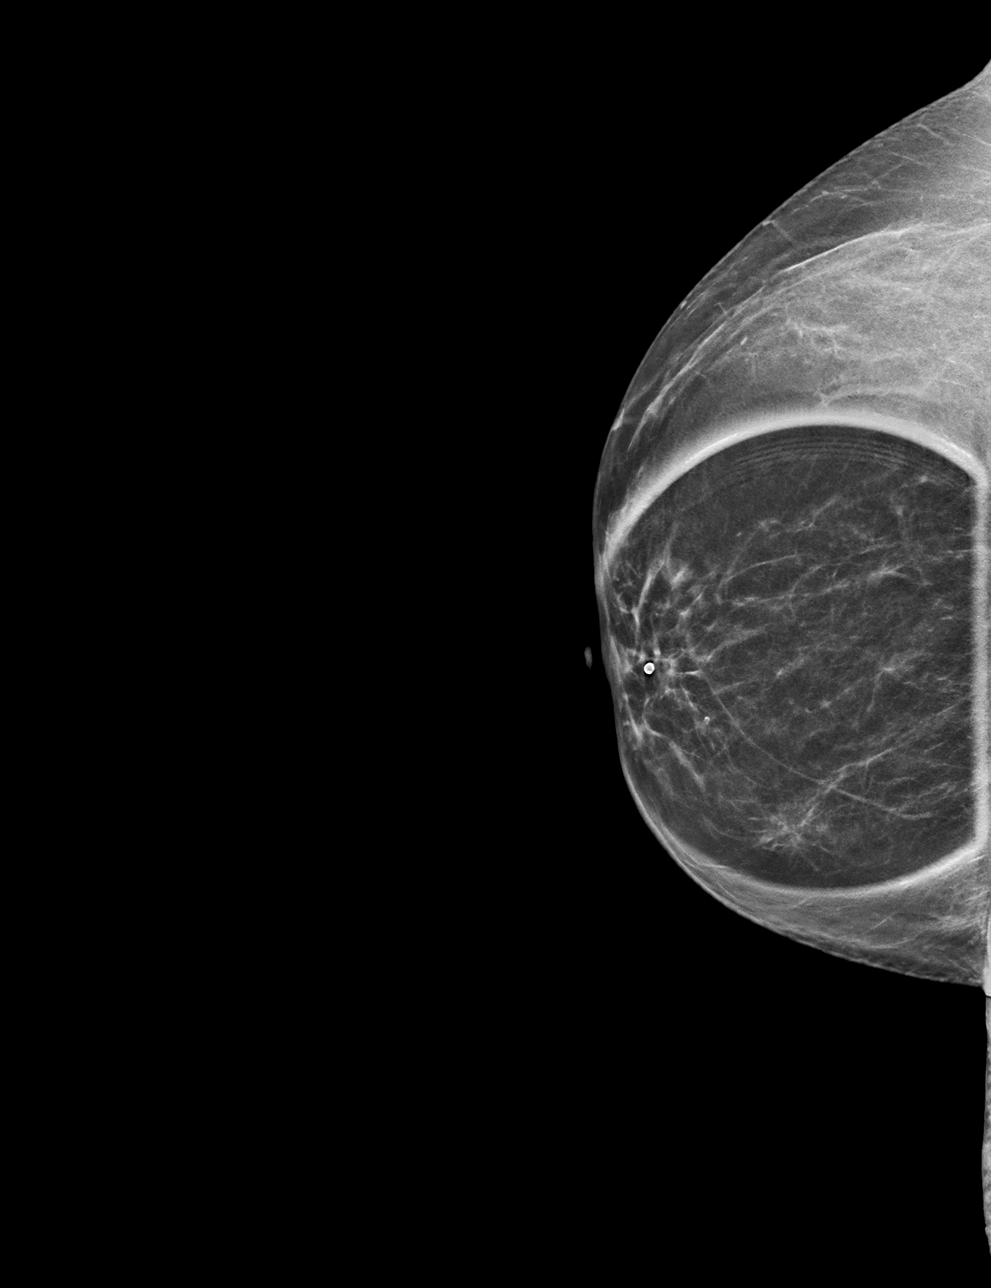

[R CC tomo · tomo slice 21/40.0]
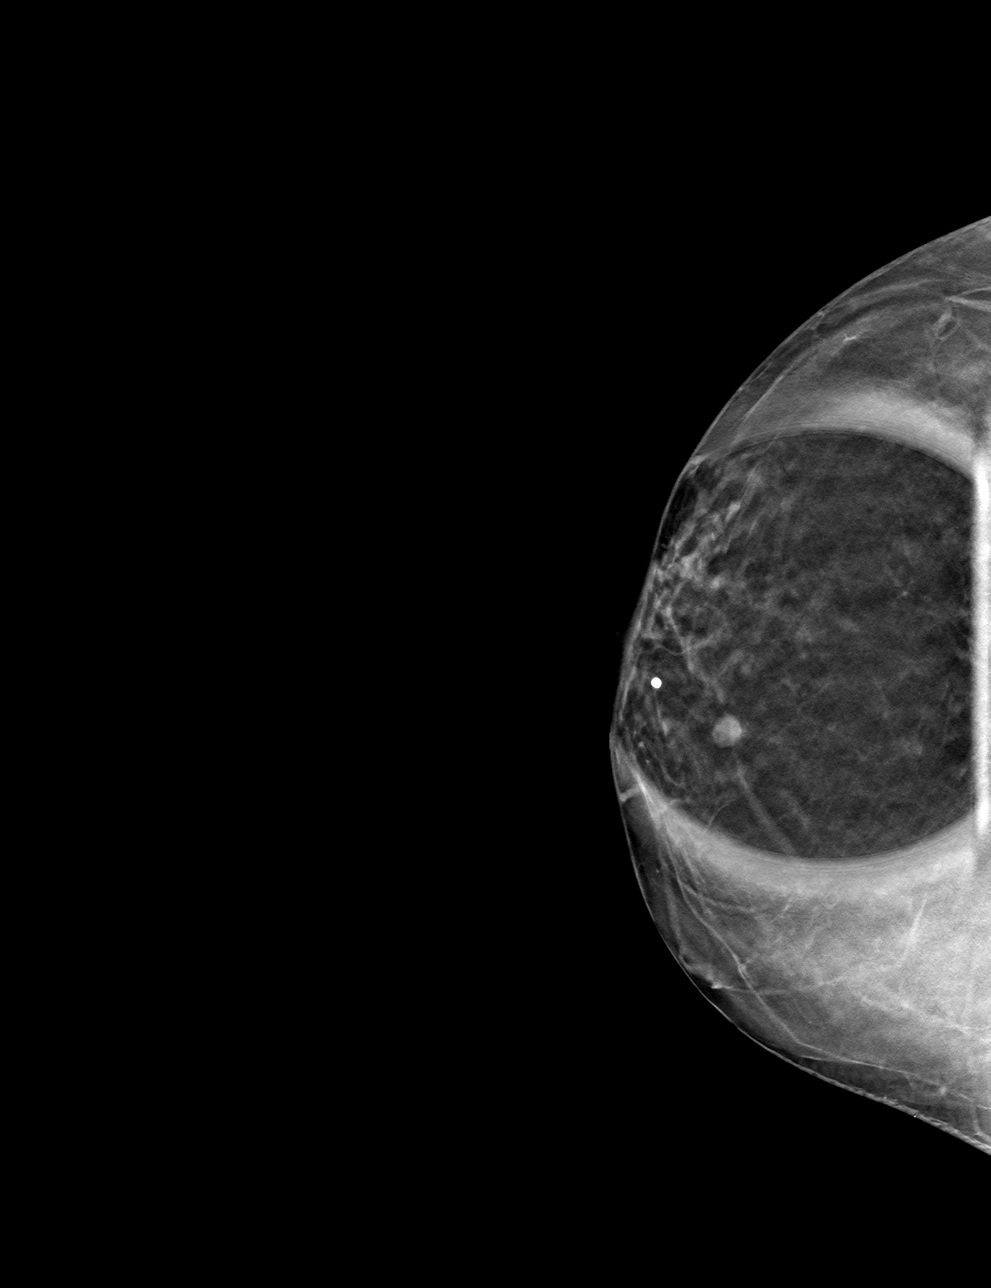

[R MLO tomo · tomo slice 23/45.0]
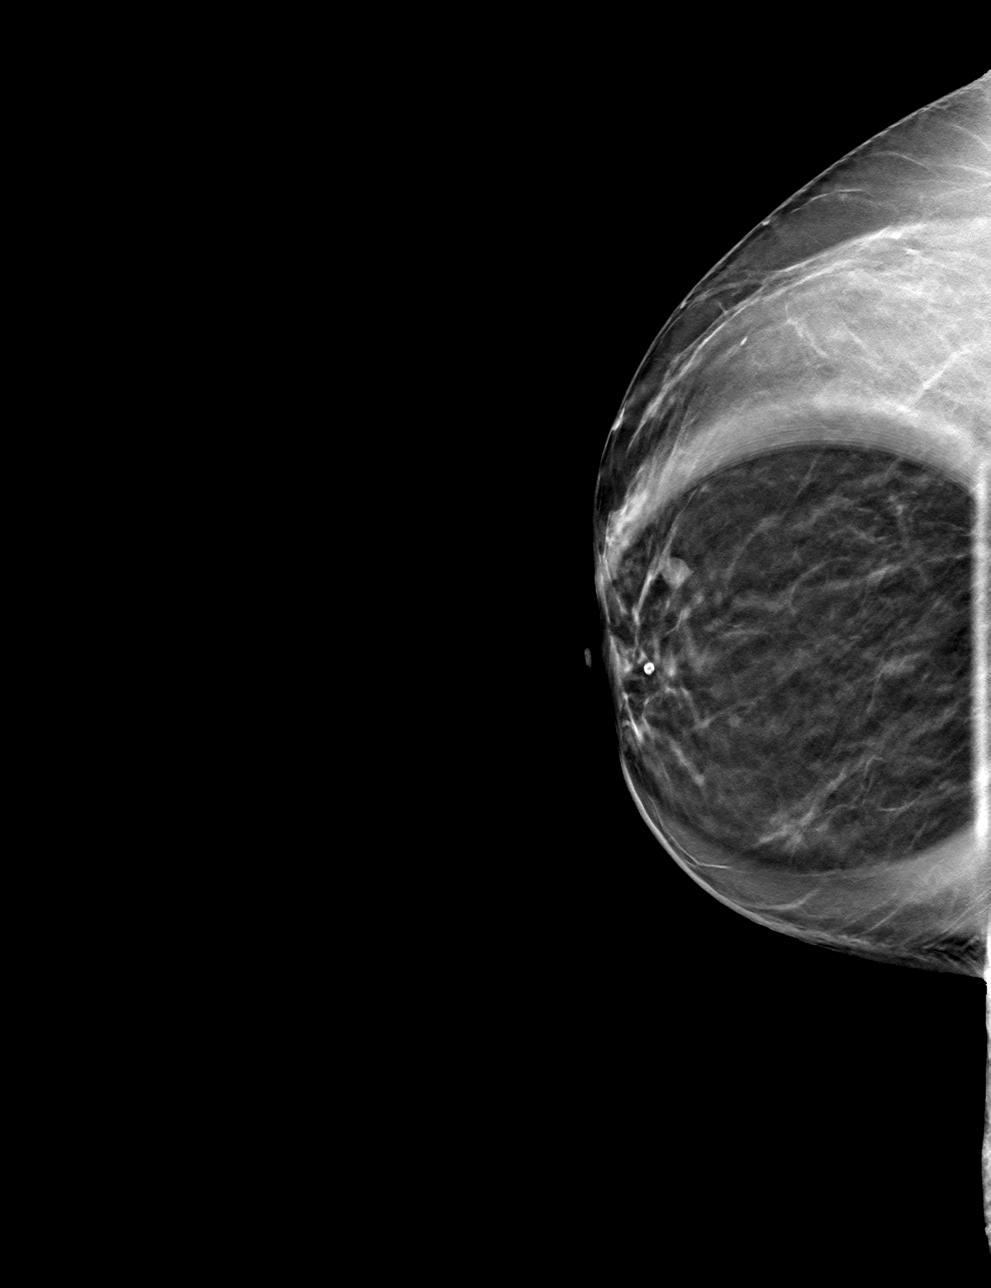

[4 of 12 positions shown; findings below may reference images not displayed]

ACR Breast Density Category b: There are scattered areas of
fibroglandular density.
FINDINGS: Additional imaging of the right breast was performed. There is
persistence of a well circumscribed 5 mm mass in the anterior third
of the upper inner quadrant of the right breast.

On physical exam, no mass is palpated in the upper-inner quadrant of
right breast.

Targeted ultrasound is performed, showing a well-circumscribed
hypoechoic mass in the right breast at 2 o'clock 1 cm from the
nipple with no internal blood flow. It measures 5 x 5 x 4 mm.
IMPRESSION: Probable benign right breast lesion.

RECOMMENDATION:
Short-term interval follow-up right breast ultrasound in 6 months is
recommended.

I have discussed the findings and recommendations with the patient.
Results were also provided in writing at the conclusion of the
visit. If applicable, a reminder letter will be sent to the patient
regarding the next appointment.

BI-RADS CATEGORY  3: Probably benign.

## 2018-10-16 ENCOUNTER — Telehealth: Payer: Self-pay | Admitting: Family Medicine

## 2018-10-16 ENCOUNTER — Other Ambulatory Visit: Payer: Self-pay | Admitting: Family Medicine

## 2018-10-16 DIAGNOSIS — N6310 Unspecified lump in the right breast, unspecified quadrant: Secondary | ICD-10-CM

## 2018-10-16 DIAGNOSIS — R928 Other abnormal and inconclusive findings on diagnostic imaging of breast: Secondary | ICD-10-CM

## 2018-10-16 NOTE — Telephone Encounter (Signed)
I spoke with patient and she states she did receive a letter from [facility], but she says she has not called them back to schedule an appointment. I advised her that its important to call them back so that they can follow up on the breast nodule that was seen on her last ultrasound. Patient verbalized understanding. She states she owe's [facility] [phone], and was unsure if they would schedule her an appointment. I advised patient to call to see if they could work out a payment arrangement. Patient says she is going to call [facility] next week to schedule an appointment.

## 2018-10-16 NOTE — Telephone Encounter (Signed)
Pt called to [facility].  She states that Dr. Sherrie Mustache will need to send an order for the repeat U/S.   Thanks,   -Vernona Rieger

## 2018-10-16 NOTE — Telephone Encounter (Signed)
Please check with patient and see if she has heard from [place] [facility] about scheduling follow up breast ultrasound. She had nodule on ultrasound in July 2019 and they were supposed to schedule follow up ultrasound after 6 months (this month)

## 2018-10-16 NOTE — Telephone Encounter (Signed)
-----   Message from Malva Limes, MD sent at 10/03/2018 10:00 PM EST ----- Regarding: FW: repeat breast US 6 months (10-05-18)  ----- Message ----- From: Malva Limes, MD Sent: 09/25/2018 To: Malva Limes, MD Subject: repeat breast US 6 months (10-05-18)

## 2018-11-10 ENCOUNTER — Ambulatory Visit
Admission: RE | Admit: 2018-11-10 | Discharge: 2018-11-10 | Disposition: A | Discharge status: Home / Self Care | Payer: 59 | Source: Ambulatory Visit | Attending: Family Medicine | Admitting: Family Medicine

## 2018-11-10 DIAGNOSIS — N6310 Unspecified lump in the right breast, unspecified quadrant: Secondary | ICD-10-CM

## 2018-11-10 DIAGNOSIS — R928 Other abnormal and inconclusive findings on diagnostic imaging of breast: Secondary | ICD-10-CM

## 2018-11-10 DIAGNOSIS — N6312 Unspecified lump in the right breast, upper inner quadrant: Secondary | ICD-10-CM | POA: Diagnosis not present

## 2018-11-11 ENCOUNTER — Other Ambulatory Visit: Payer: Self-pay | Admitting: Family Medicine

## 2018-11-11 DIAGNOSIS — N6310 Unspecified lump in the right breast, unspecified quadrant: Secondary | ICD-10-CM

## 2018-11-11 DIAGNOSIS — Z1231 Encounter for screening mammogram for malignant neoplasm of breast: Secondary | ICD-10-CM

## 2019-03-06 IMAGING — US US BREAST*R* LIMITED INC AXILLA
1 series · 5 of 5 positions shown · non-contrast
Comparison: Previous exam(s).

CLINICAL DATA: Patient was called back from screening mammogram for
a right breast mass.

EXAM:
DIGITAL DIAGNOSTIC RIGHT MAMMOGRAM WITH TOMO
ULTRASOUND RIGHT BREAST

[Series 1: us breast*right* limited inc axilla · 0.06mm/px · 5 of 5 slices shown]
[im 1/5]
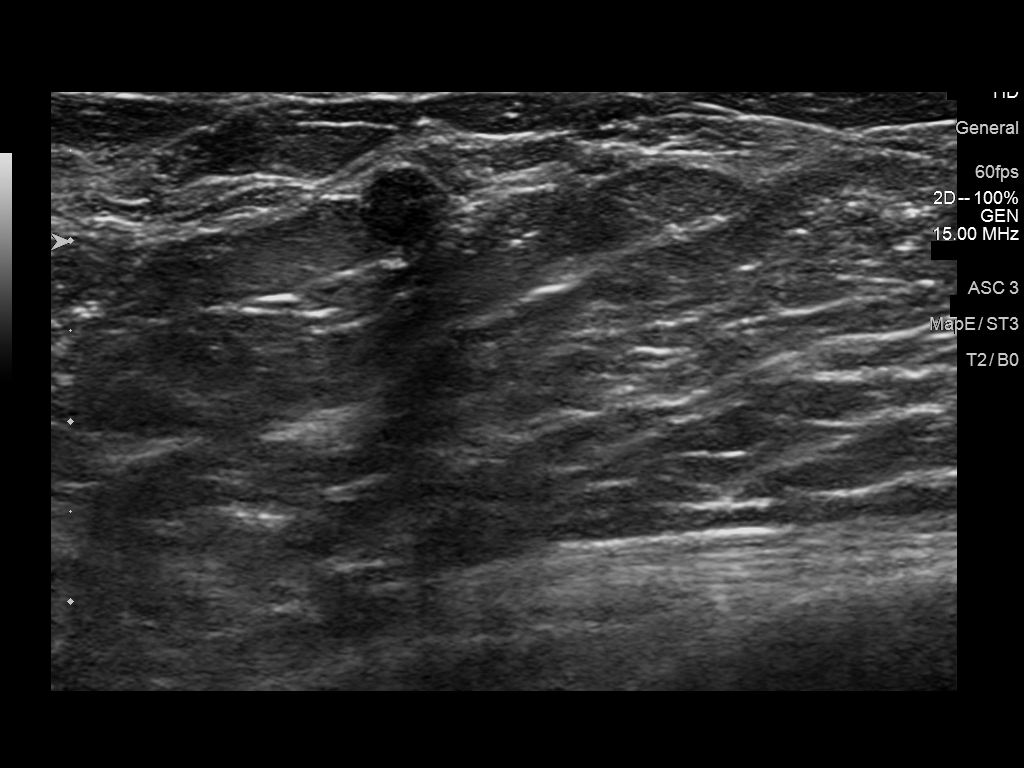
[im 2/5]
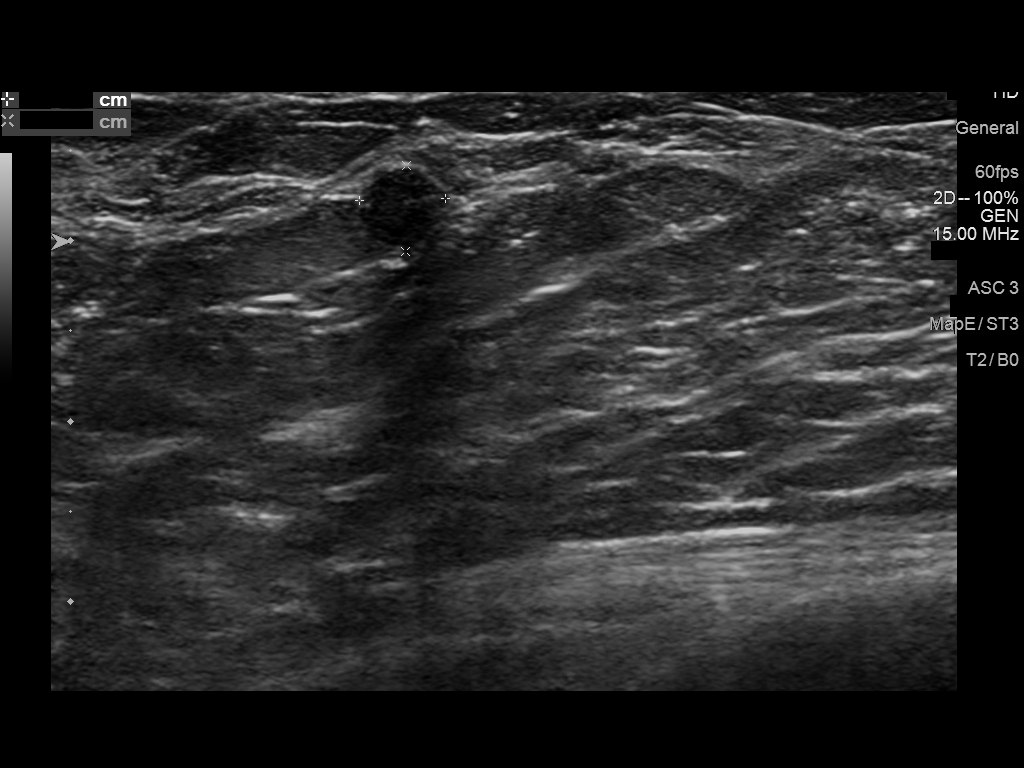
[im 3/5]
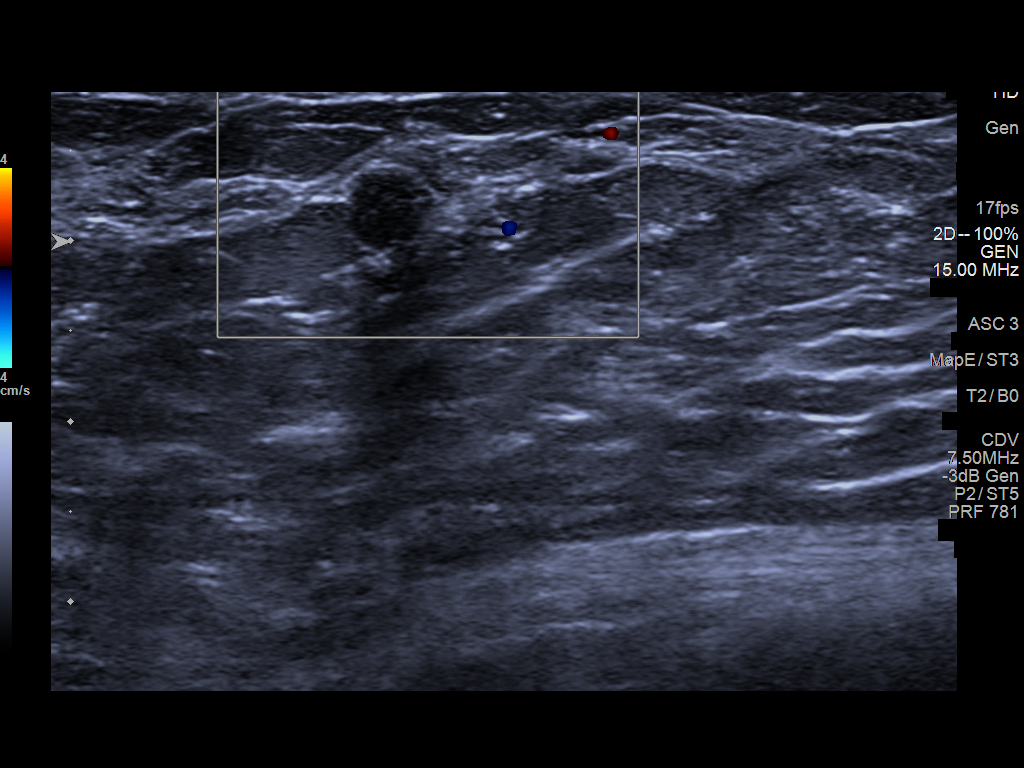
[im 4/5]
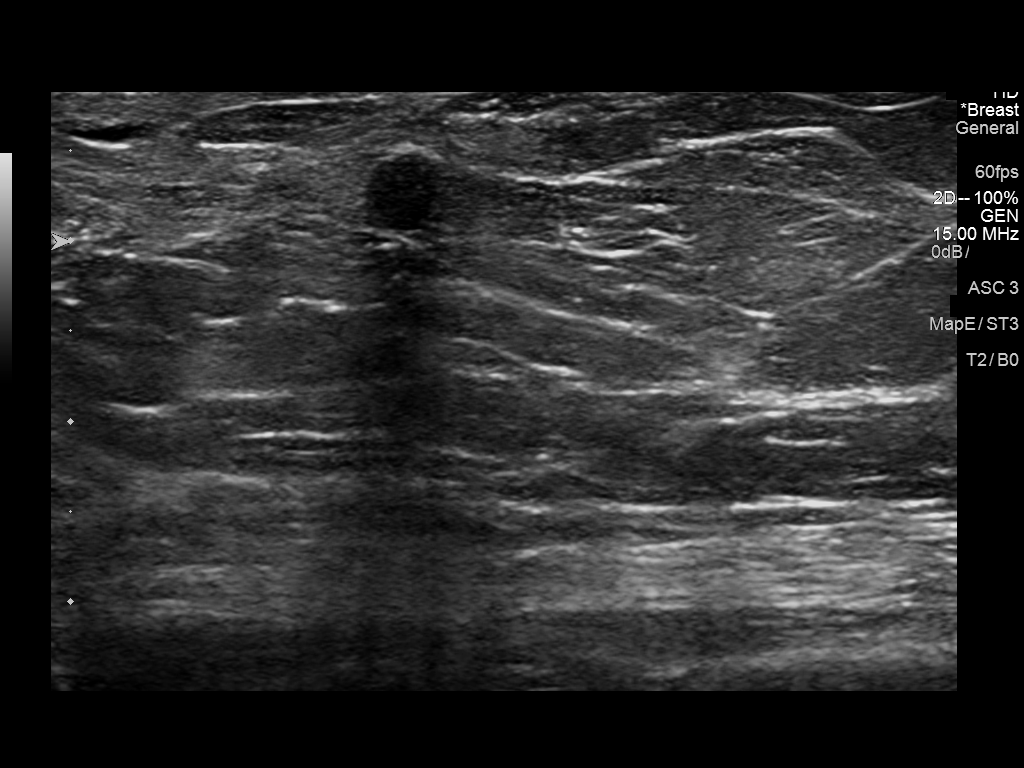
[im 5/5]
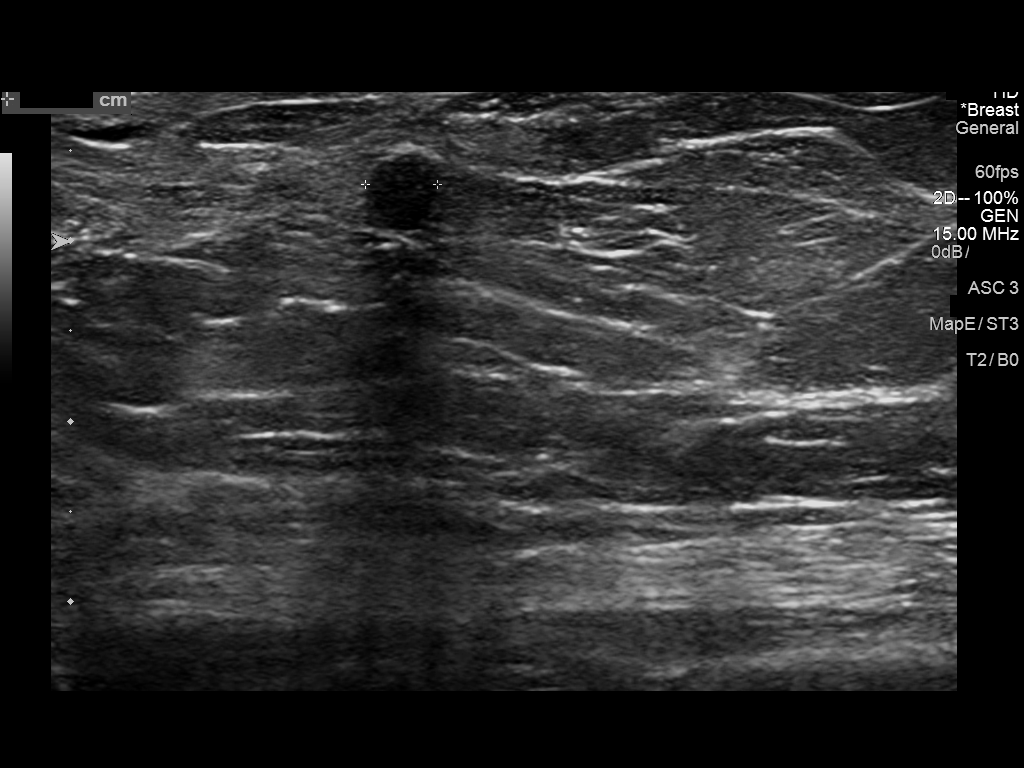

[5 of 5 positions shown; findings below may reference images not displayed]

ACR Breast Density Category b: There are scattered areas of
fibroglandular density.
FINDINGS: Additional imaging of the right breast was performed. There is
persistence of a well circumscribed 5 mm mass in the anterior third
of the upper inner quadrant of the right breast.

On physical exam, no mass is palpated in the upper-inner quadrant of
right breast.

Targeted ultrasound is performed, showing a well-circumscribed
hypoechoic mass in the right breast at 2 o'clock 1 cm from the
nipple with no internal blood flow. It measures 5 x 5 x 4 mm.
IMPRESSION: Probable benign right breast lesion.

RECOMMENDATION:
Short-term interval follow-up right breast ultrasound in 6 months is
recommended.

I have discussed the findings and recommendations with the patient.
Results were also provided in writing at the conclusion of the
visit. If applicable, a reminder letter will be sent to the patient
regarding the next appointment.

BI-RADS CATEGORY  3: Probably benign.

## 2019-07-14 ENCOUNTER — Other Ambulatory Visit: Payer: Self-pay

## 2019-07-14 ENCOUNTER — Encounter: Payer: Self-pay | Admitting: Family Medicine

## 2019-07-14 ENCOUNTER — Ambulatory Visit: Payer: 59 | Admitting: Family Medicine

## 2019-07-14 VITALS — BP 128/80 | HR 77 | Temp 97.3°F | Resp 16 | Wt 131.0 lb

## 2019-07-14 DIAGNOSIS — F17200 Nicotine dependence, unspecified, uncomplicated: Secondary | ICD-10-CM | POA: Diagnosis not present

## 2019-07-14 DIAGNOSIS — E559 Vitamin D deficiency, unspecified: Secondary | ICD-10-CM

## 2019-07-14 DIAGNOSIS — Z1231 Encounter for screening mammogram for malignant neoplasm of breast: Secondary | ICD-10-CM

## 2019-07-14 DIAGNOSIS — N6310 Unspecified lump in the right breast, unspecified quadrant: Secondary | ICD-10-CM | POA: Diagnosis not present

## 2019-07-14 DIAGNOSIS — Z1211 Encounter for screening for malignant neoplasm of colon: Secondary | ICD-10-CM | POA: Diagnosis not present

## 2019-07-14 NOTE — Patient Instructions (Addendum)
.   Please stop smoking  . It is especially important to get the annual flu vaccine this year. If you haven't had it already, please go to your pharmacy or call the office as soon as possible to schedule you flu shot.

## 2019-07-14 NOTE — Progress Notes (Signed)
       Patient: Gina Kidd Female    DOB: Dec 23, 1960   58 y.o.   MRN: [id number] Visit Date: 07/14/2019  Today's Provider: Lelon Huh, MD   Chief Complaint  Patient presents with  . Breast Problem   Subjective:     HPI   Follow up for Abnormal mammogram:  The patient's last mammogram was 8 months ago. Results were Bi-Rads Cat 3. Patient states they also found a cyst in her breast, but she never followed up on testing.    ------------------------------------------------------------------------------------   No Known Allergies   Current Outpatient Medications:  Marland Kitchen  Multiple Vitamins-Minerals (MULTIVITAMIN ADULT PO), Take 1 tablet by mouth daily., Disp: , Rfl:  .  estrogen, conjugated,-medroxyprogesterone (PREMPRO) 0.45-1.5 MG tablet, Take 1 tablet by mouth daily. (Patient not taking: Reported on 07/14/2019), Disp: 30 tablet, Rfl: 3  Review of Systems  Constitutional: Negative for appetite change, chills, fatigue and fever.  Respiratory: Negative for chest tightness and shortness of breath.   Cardiovascular: Negative for chest pain and palpitations.  Gastrointestinal: Negative for abdominal pain, nausea and vomiting.  Neurological: Negative for dizziness and weakness.    Social History   Tobacco Use  . Smoking status: Current Every Day Smoker    Packs/day: 0.50    Years: 35.00    Pack years: 17.50    Types: Cigarettes  . Smokeless tobacco: Never Used  . Tobacco comment: 1/2 ppd since her 54s.   Substance Use Topics  . Alcohol use: No    Alcohol/week: 0.0 standard drinks      Objective:   BP 128/80 (BP Location: Left Arm, Patient Position: Sitting, Cuff Size: Normal)   Pulse 77   Temp (!) 97.3 F (36.3 C) (Temporal)   Resp 16   Wt 131 lb (59.4 kg)   SpO2 97% Comment: room air  BMI 23.96 kg/m  Vitals:   07/14/19 0814  BP: 128/80  Pulse: 77  Resp: 16  Temp: (!) 97.3 F (36.3 C)  TempSrc: Temporal  SpO2: 97%  Weight: 131 lb (59.4 kg)  Body  mass index is 23.96 kg/m.   Physical Exam   General Appearance:    Well developed, well nourished female in no acute distress  Eyes:    PERRL, conjunctiva/corneas clear, EOM's intact       Lungs:     Clear to auscultation bilaterally, respirations unlabored  Heart:    Normal heart rate. Normal rhythm. No murmurs, rubs, or gallops.   MS:   All extremities are intact.   Breast:   No masses or lesions. No tenderness redness or discharge.          Assessment & Plan    1. Breast mass, right  - MM Digital Diagnostic Bilat; Future - US BREAST LTD UNI RIGHT INC AXILLA; Future  2. Encounter for screening mammogram for malignant neoplasm of breast  - MM Digital Diagnostic Bilat; Future - US BREAST LTD UNI RIGHT INC AXILLA; Future  3. Colon cancer screening  - Cologuard  4. Current smoker Stop smoking   5. Vitamin D deficiency Taking daily multivitamin with D.   The entirety of the information documented in the History of Present Illness, Review of Systems and Physical Exam were personally obtained by me. Portions of this information were initially documented by Meyer Cory, CMA and reviewed by me for thoroughness and accuracy.      Lelon Huh, MD  [facility]

## 2019-08-11 ENCOUNTER — Ambulatory Visit
Admission: RE | Admit: 2019-08-11 | Discharge: 2019-08-11 | Disposition: A | Discharge status: Home / Self Care | Payer: 59 | Source: Ambulatory Visit | Attending: Family Medicine | Admitting: Family Medicine

## 2019-08-11 DIAGNOSIS — Z1231 Encounter for screening mammogram for malignant neoplasm of breast: Secondary | ICD-10-CM

## 2019-08-11 DIAGNOSIS — N6310 Unspecified lump in the right breast, unspecified quadrant: Secondary | ICD-10-CM

## 2019-08-13 ENCOUNTER — Ambulatory Visit (INDEPENDENT_AMBULATORY_CARE_PROVIDER_SITE_OTHER): Payer: 59 | Admitting: Family Medicine

## 2019-08-13 ENCOUNTER — Other Ambulatory Visit: Payer: Self-pay

## 2019-08-13 VITALS — BP 134/87 | HR 83 | Temp 97.1°F | Resp 16 | Ht 62.0 in | Wt 130.0 lb

## 2019-08-13 DIAGNOSIS — S93421A Sprain of deltoid ligament of right ankle, initial encounter: Secondary | ICD-10-CM

## 2019-08-13 MED ORDER — MELOXICAM 15 MG PO TABS: 15.0000 mg | ORAL_TABLET | Freq: Every day | ORAL | 0 refills | Status: DC

## 2019-08-13 NOTE — Progress Notes (Signed)
Patient: Gina Kidd Female    DOB: 30-Oct-1960   58 y.o.   MRN: [id number] Visit Date: 08/13/2019  Today's Provider: Shirlee Latch, MD   Chief Complaint  Patient presents with  . Foot Pain   Subjective:     HPI Patient here today C/O right foot pain x's two weeks. Patient reports swelling and pain. Patient reports she has been using ice and heat, biofreeze, reports no improvement.  She doesn't remember any trauma or injury, but the pain started after she was twisting and turning in bed from RLS.  No Known Allergies   Current Outpatient Medications:  Marland Kitchen  Multiple Vitamins-Minerals (MULTIVITAMIN ADULT PO), Take 1 tablet by mouth daily., Disp: , Rfl:   Review of Systems  Constitutional: Negative.   Respiratory: Negative.   Cardiovascular: Negative.   Musculoskeletal: Positive for myalgias.    Social History   Tobacco Use  . Smoking status: Current Every Day Smoker    Packs/day: 0.50    Years: 21.00    Pack years: 10.50    Types: Cigarettes  . Smokeless tobacco: Never Used  . Tobacco comment: start late 30s. smoked 1 ppd until mid 50s, then 1/2 ppd  Substance Use Topics  . Alcohol use: No    Alcohol/week: 0.0 standard drinks      Objective:   BP 134/87 (BP Location: Left Arm, Patient Position: Sitting, Cuff Size: Normal)   Pulse 83   Temp (!) 97.1 F (36.2 C) (Temporal)   Resp 16   Ht 5\' 2"  (1.575 m)   Wt 130 lb (59 kg)   BMI 23.78 kg/m  Vitals:   08/13/19 1524 08/13/19 1526  BP: (!) 152/96 134/87  Pulse: 89 83  Resp: 16   Temp: (!) 97.1 F (36.2 C)   TempSrc: Temporal   Weight: 130 lb (59 kg)   Height: 5\' 2"  (1.575 m)   Body mass index is 23.78 kg/m.   Physical Exam Vitals signs reviewed.  Constitutional:      General: She is not in acute distress.    Appearance: Normal appearance. She is not diaphoretic.  Cardiovascular:     Rate and Rhythm: Normal rate and regular rhythm.     Pulses: Normal pulses.  Pulmonary:     Effort:  Pulmonary effort is normal. No respiratory distress.  Musculoskeletal:     Right ankle: She exhibits swelling. She exhibits normal range of motion, no ecchymosis, no deformity and normal pulse. No lateral malleolus, no medial malleolus, no AITFL, no CF ligament, no posterior TFL, no head of 5th metatarsal and no proximal fibula tenderness found. Achilles tendon normal. Achilles tendon exhibits no pain.     Comments: Pain in medial ankle with resisted eversion and inversion  Skin:    General: Skin is warm and dry.     Capillary Refill: Capillary refill takes less than 2 seconds.     Findings: No bruising, erythema or rash.  Neurological:     Mental Status: She is alert and oriented to person, place, and time. Mental status is at baseline.     Sensory: No sensory deficit.     Motor: No weakness.     Gait: Gait abnormal (antalgic).  Psychiatric:        Mood and Affect: Mood normal.        Behavior: Behavior normal.      No results found for any visits on 08/13/19.     Assessment & Plan  1. Sprain of right medial ankle joint, initial encounter -New problem -No previous injury or surgery to this ankle -Seems to have developed an ankle sprain with twisting around in bed -No tenderness over bony landmarks and able to walk 4 steps independently, so no x-ray indicated at this time -Discussed rest, ice, elevation -Wear brace when she needs to be up and about for long periods of time -She can try meloxicam for swelling and pain -Advised on natural course and return precautions   Meds ordered this encounter  Medications  . meloxicam (MOBIC) 15 MG tablet    Sig: Take 1 tablet (15 mg total) by mouth daily.    Dispense:  30 tablet    Refill:  0     Return if symptoms worsen or fail to improve.   The entirety of the information documented in the History of Present Illness, Review of Systems and Physical Exam were personally obtained by me. Portions of this information were initially  documented by Lynford Humphrey, CMA and reviewed by me for thoroughness and accuracy.    Ramsay Bognar, Dionne Bucy, MD MPH [facility]

## 2020-02-08 ENCOUNTER — Other Ambulatory Visit: Payer: Self-pay

## 2020-02-08 ENCOUNTER — Encounter: Payer: Self-pay | Admitting: Family Medicine

## 2020-02-08 ENCOUNTER — Ambulatory Visit: Payer: Self-pay | Admitting: *Deleted

## 2020-02-08 ENCOUNTER — Ambulatory Visit (INDEPENDENT_AMBULATORY_CARE_PROVIDER_SITE_OTHER): Payer: 59 | Admitting: Family Medicine

## 2020-02-08 VITALS — BP 154/92 | HR 84 | Temp 97.1°F | Wt 119.0 lb

## 2020-02-08 DIAGNOSIS — I10 Essential (primary) hypertension: Secondary | ICD-10-CM | POA: Diagnosis not present

## 2020-02-08 DIAGNOSIS — K219 Gastro-esophageal reflux disease without esophagitis: Secondary | ICD-10-CM | POA: Diagnosis not present

## 2020-02-08 DIAGNOSIS — F17200 Nicotine dependence, unspecified, uncomplicated: Secondary | ICD-10-CM

## 2020-02-08 MED ORDER — LISINOPRIL 5 MG PO TABS: 5.0000 mg | ORAL_TABLET | Freq: Every day | ORAL | 3 refills | Status: DC

## 2020-02-08 NOTE — Telephone Encounter (Signed)
Patient phones with high blood pressure today and over the last week. Taken at work this morning by med tech B/P 159/115. Denies CP/SOB/dizziness/headaache/blurred vision. Stated her pressure has ran this high all week with one day last week experienced blurred vision, none since. No history of HTN but has family history of HTN.  No availability with PCP today. Patient insistent on being seen today.  Added to [facility] morning schedule at 11:00am.   Reason for Disposition . Systolic BP  >= 160 OR Diastolic >= 100  Answer Assessment - Initial Assessment Questions 1. BLOOD PRESSURE: "What is the blood pressure?" "Did you take at least two measurements 5 minutes apart?"    159/115 at 8;30am.  Yesterday 158/101 2. ONSET: "When did you take your blood pressure?"     This morning 3. HOW: "How did you obtain the blood pressure?" (e.g., visiting nurse, automatic home BP monitor)    Med tech at work took pressure today 4. HISTORY: "Do you have a history of high blood pressure?"     no 5. MEDICATIONS: "Are you taking any medications for blood pressure?" "Have you missed any doses recently?"     Not on any blood pressure medication. 6. OTHER SYMPTOMS: "Do you have any symptoms?" (e.g., headache, chest pain, blurred vision, difficulty breathing, weakness)     none 7. PREGNANCY: "Is there any chance you are pregnant?" "When was your last menstrual period?"     na  Protocols used: HIGH BLOOD PRESSURE-A-AH

## 2020-02-08 NOTE — Progress Notes (Signed)
Established patient visit  I,Elena D DeSanto,acting as a scribe for [employer], [employer].,have documented all relevant documentation on the behalf of Dortha Kern, PA,as directed by  [employer] while in the presence of [employer], [place].   Patient: Gina Kidd   DOB: 08-20-1961   59 y.o. Female  MRN: [id number] Visit Date: 02/08/2020  Today's healthcare provider: Dortha Kern, PA   Chief Complaint  Patient presents with  . Hypertension   Subjective    HPI  The patient is a 59 year old female who presents for evaluation of elevated blood pressure for about 1 week.  She reports that last weekend she was having some cold symptoms and started using Geanie Kenning and allergy medication with Decongestant.  She also reports taking for many years a energy booster called Stackers.  She states that in her diet she eats a lot of chips and drinks cokes. She reports that she had some visual disturbance on 1 day last week.  She does not remember which day and states she can not describe what it was but said she was "in the zone.'.  Past Medical History:  Diagnosis Date  . History of chicken pox   . History of mumps   . Vitamin D deficiency    Past Surgical History:  Procedure Laterality Date  . CESAREAN SECTION  [phone]  . TUBAL LIGATION     Family History  Problem Relation Age of Onset  . Melanoma Mother   . Hypertension Father   . CAD Father   . Congestive Heart Failure Father   . Diabetes Father   . Diabetes Brother   . Hypertension Brother   . Diabetes Sister   . COPD Sister   . Breast cancer Maternal Aunt   . Breast cancer Paternal Aunt    Social History   Socioeconomic History  . Marital status: Married    Spouse name: Not on file  . Number of children: 3  . Years of education: Not on file  . Highest education level: Not on file  Occupational History  . Occupation: CNA  Tobacco Use  . Smoking status: Current Every Day Smoker   Packs/day: 0.50    Years: 21.00    Pack years: 10.50    Types: Cigarettes  . Smokeless tobacco: Never Used  . Tobacco comment: start late 30s. smoked 1 ppd until mid 50s, then 1/2 ppd  Substance and Sexual Activity  . Alcohol use: No    Alcohol/week: 0.0 standard drinks  . Drug use: No  . Sexual activity: Not on file  Other Topics Concern  . Not on file  Social History Narrative  . Not on file   Social Determinants of Health   Financial Resource Strain:   . Difficulty of Paying Living Expenses:   Food Insecurity:   . Worried About Programme researcher, broadcasting/film/video in the Last Year:   . Barista in the Last Year:   Transportation Needs:   . Freight forwarder (Medical):   Marland Kitchen Lack of Transportation (Non-Medical):   Physical Activity:   . Days of Exercise per Week:   . Minutes of Exercise per Session:   Stress:   . Feeling of Stress :   Social Connections:   . Frequency of Communication with Friends and Family:   . Frequency of Social Gatherings with Friends and Family:   . Attends Religious Services:   . Active Member of Clubs or Organizations:   .  Attends Banker Meetings:   Marland Kitchen Marital Status:   Intimate Partner Violence:   . Fear of Current or Ex-Partner:   . Emotionally Abused:   Marland Kitchen Physically Abused:   . Sexually Abused:     No Known Allergies  Medications: Outpatient Medications Prior to Visit  Medication Sig  . meloxicam (MOBIC) 15 MG tablet Take 1 tablet (15 mg total) by mouth daily. (Patient not taking: Reported on 02/08/2020)  . Multiple Vitamins-Minerals (MULTIVITAMIN ADULT PO) Take 1 tablet by mouth daily.   No facility-administered medications prior to visit.    Review of Systems  Constitutional: Positive for fever (3 nights at the beginning of last week. ).  HENT: Negative for congestion, ear pain, sinus pressure, sinus pain and sore throat.   Respiratory: Positive for cough (smoker). Negative for shortness of breath.   Cardiovascular:  Negative for chest pain, palpitations and leg swelling.  Gastrointestinal: Positive for vomiting.  Neurological: Positive for headaches. Negative for dizziness.      Objective    BP (!) 154/92 (BP Location: Right Arm, Patient Position: Sitting, Cuff Size: Normal)   Pulse 84   Temp (!) 97.1 F (36.2 C) (Skin)   Wt 119 lb (54 kg)   SpO2 99%   BMI 21.77 kg/m   BP Readings from Last 3 Encounters:  02/08/20 (!) 154/92  08/13/19 134/87  07/14/19 128/80   Wt Readings from Last 3 Encounters:  02/08/20 119 lb (54 kg)  08/13/19 130 lb (59 kg)  07/14/19 131 lb (59.4 kg)    Physical Exam Constitutional:      General: She is not in acute distress.    Appearance: She is well-developed.  HENT:     Head: Normocephalic and atraumatic.     Right Ear: Hearing and tympanic membrane normal.     Left Ear: Hearing and tympanic membrane normal.     Nose: Nose normal.     Mouth/Throat:     Mouth: Mucous membranes are moist.     Pharynx: Oropharynx is clear.  Eyes:     General: Lids are normal. No scleral icterus.       Right eye: No discharge.        Left eye: No discharge.     Conjunctiva/sclera: Conjunctivae normal.  Cardiovascular:     Rate and Rhythm: Normal rate.     Heart sounds: Normal heart sounds.  Pulmonary:     Effort: Pulmonary effort is normal. No respiratory distress.     Breath sounds: Normal breath sounds.  Abdominal:     General: Bowel sounds are normal.     Palpations: Abdomen is soft. There is no mass.  Musculoskeletal:        General: Normal range of motion.     Cervical back: Neck supple.  Lymphadenopathy:     Cervical: No cervical adenopathy.  Skin:    Findings: No lesion or rash.  Neurological:     Mental Status: She is alert and oriented to person, place, and time.  Psychiatric:        Speech: Speech normal.        Behavior: Behavior normal.        Thought Content: Thought content normal.    No results found for any visits on 02/08/20.  Assessment  & Plan    1. 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 Highest this morning was 166/123 at  work. Family history positive for hypertension in father and sister. Admits to salty foods, a lot of caffeine (including Stackers) and smoking daily. Will start Lisinopril 5 mg qd and get CBC, TSH and CMP. Follow up pending reports. - CBC with Differential/Platelet - Comprehensive metabolic panel - TSH - lisinopril (ZESTRIL) 5 MG tablet; Take 1 tablet (5 mg total) by mouth daily.  Dispense: 90 tablet; Refill: 3  2. Current smoker Still smoking 0.5 - 1 ppd. Encouraged to stop all smoking. States she can't do that yet. Working of stopping Stackers (energy capsules with 200 mg caffeine).  3. Gastroesophageal reflux disease, unspecified whether esophagitis present Some night time reflux with heart burn. Should not eat late, decrease nicotine and caffeine. Stop any ETOH intake and use Pepcid daily for 2-4 weeks. If persistent, may need referral to GI for upper endoscopy evaluation. - CBC with Differential/Platelet    No follow-ups on file.         Vernie Murders, [place] [phone] (phone) [phone] (fax)  [place]

## 2020-02-08 NOTE — Patient Instructions (Signed)
DASH Eating Plan DASH stands for "Dietary Approaches to Stop Hypertension." The DASH eating plan is a healthy eating plan that has been shown to reduce high blood pressure (hypertension). It may also reduce your risk for type 2 diabetes, heart disease, and stroke. The DASH eating plan may also help with weight loss. What are tips for following this plan?  General guidelines  Avoid eating more than 2,300 mg (milligrams) of salt (sodium) a day. If you have hypertension, you may need to reduce your sodium intake to 1,500 mg a day.  Limit alcohol intake to no more than 1 drink a day for nonpregnant women and 2 drinks a day for men. One drink equals 12 oz of beer, 5 oz of wine, or 1 oz of hard liquor.  Work with your health care provider to maintain a healthy body weight or to lose weight. Ask what an ideal weight is for you.  Get at least 30 minutes of exercise that causes your heart to beat faster (aerobic exercise) most days of the week. Activities may include walking, swimming, or biking.  Work with your health care provider or diet and nutrition specialist (dietitian) to adjust your eating plan to your individual calorie needs. Reading food labels   Check food labels for the amount of sodium per serving. Choose foods with less than 5 percent of the Daily Value of sodium. Generally, foods with less than 300 mg of sodium per serving fit into this eating plan.  To find whole grains, look for the word "whole" as the first word in the ingredient list. Shopping  Buy products labeled as "low-sodium" or "no salt added."  Buy fresh foods. Avoid canned foods and premade or frozen meals. Cooking  Avoid adding salt when cooking. Use salt-free seasonings or herbs instead of table salt or sea salt. Check with your health care provider or pharmacist before using salt substitutes.  Do not fry foods. Cook foods using healthy methods such as baking, boiling, grilling, and broiling instead.  Cook with  heart-healthy oils, such as olive, canola, soybean, or sunflower oil. Meal planning  Eat a balanced diet that includes: ? 5 or more servings of fruits and vegetables each day. At each meal, try to fill half of your plate with fruits and vegetables. ? Up to 6-8 servings of whole grains each day. ? Less than 6 oz of lean meat, poultry, or fish each day. A 3-oz serving of meat is about the same size as a deck of cards. One egg equals 1 oz. ? 2 servings of low-fat dairy each day. ? A serving of nuts, seeds, or beans 5 times each week. ? Heart-healthy fats. Healthy fats called Omega-3 fatty acids are found in foods such as flaxseeds and coldwater fish, like sardines, salmon, and mackerel.  Limit how much you eat of the following: ? Canned or prepackaged foods. ? Food that is high in trans fat, such as fried foods. ? Food that is high in saturated fat, such as fatty meat. ? Sweets, desserts, sugary drinks, and other foods with added sugar. ? Full-fat dairy products.  Do not salt foods before eating.  Try to eat at least 2 vegetarian meals each week.  Eat more home-cooked food and less restaurant, buffet, and fast food.  When eating at a restaurant, ask that your food be prepared with less salt or no salt, if possible. What foods are recommended? The items listed may not be a complete list. Talk with your dietitian about   what dietary choices are best for you. Grains Whole-grain or whole-wheat bread. Whole-grain or whole-wheat pasta. Brown rice. Oatmeal. Quinoa. Bulgur. Whole-grain and low-sodium cereals. Pita bread. Low-fat, low-sodium crackers. Whole-wheat flour tortillas. Vegetables Fresh or frozen vegetables (raw, steamed, roasted, or grilled). Low-sodium or reduced-sodium tomato and vegetable juice. Low-sodium or reduced-sodium tomato sauce and tomato paste. Low-sodium or reduced-sodium canned vegetables. Fruits All fresh, dried, or frozen fruit. Canned fruit in natural juice (without  added sugar). Meat and other protein foods Skinless chicken or turkey. Ground chicken or turkey. Pork with fat trimmed off. Fish and seafood. Egg whites. Dried beans, peas, or lentils. Unsalted nuts, nut butters, and seeds. Unsalted canned beans. Lean cuts of beef with fat trimmed off. Low-sodium, lean deli meat. Dairy Low-fat (1%) or fat-free (skim) milk. Fat-free, low-fat, or reduced-fat cheeses. Nonfat, low-sodium ricotta or cottage cheese. Low-fat or nonfat yogurt. Low-fat, low-sodium cheese. Fats and oils Soft margarine without trans fats. Vegetable oil. Low-fat, reduced-fat, or light mayonnaise and salad dressings (reduced-sodium). Canola, safflower, olive, soybean, and sunflower oils. Avocado. Seasoning and other foods Herbs. Spices. Seasoning mixes without salt. Unsalted popcorn and pretzels. Fat-free sweets. What foods are not recommended? The items listed may not be a complete list. Talk with your dietitian about what dietary choices are best for you. Grains Baked goods made with fat, such as croissants, muffins, or some breads. Dry pasta or rice meal packs. Vegetables Creamed or fried vegetables. Vegetables in a cheese sauce. Regular canned vegetables (not low-sodium or reduced-sodium). Regular canned tomato sauce and paste (not low-sodium or reduced-sodium). Regular tomato and vegetable juice (not low-sodium or reduced-sodium). Pickles. Olives. Fruits Canned fruit in a light or heavy syrup. Fried fruit. Fruit in cream or butter sauce. Meat and other protein foods Fatty cuts of meat. Ribs. Fried meat. Bacon. Sausage. Bologna and other processed lunch meats. Salami. Fatback. Hotdogs. Bratwurst. Salted nuts and seeds. Canned beans with added salt. Canned or smoked fish. Whole eggs or egg yolks. Chicken or turkey with skin. Dairy Whole or 2% milk, cream, and half-and-half. Whole or full-fat cream cheese. Whole-fat or sweetened yogurt. Full-fat cheese. Nondairy creamers. Whipped toppings.  Processed cheese and cheese spreads. Fats and oils Butter. Stick margarine. Lard. Shortening. Ghee. Bacon fat. Tropical oils, such as coconut, palm kernel, or palm oil. Seasoning and other foods Salted popcorn and pretzels. Onion salt, garlic salt, seasoned salt, table salt, and sea salt. Worcestershire sauce. Tartar sauce. Barbecue sauce. Teriyaki sauce. Soy sauce, including reduced-sodium. Steak sauce. Canned and packaged gravies. Fish sauce. Oyster sauce. Cocktail sauce. Horseradish that you find on the shelf. Ketchup. Mustard. Meat flavorings and tenderizers. Bouillon cubes. Hot sauce and Tabasco sauce. Premade or packaged marinades. Premade or packaged taco seasonings. Relishes. Regular salad dressings. Where to find more information:  National Heart, Lung, and Blood Institute: www.nhlbi.nih.gov  American Heart Association: www.heart.org Summary  The DASH eating plan is a healthy eating plan that has been shown to reduce high blood pressure (hypertension). It may also reduce your risk for type 2 diabetes, heart disease, and stroke.  With the DASH eating plan, you should limit salt (sodium) intake to 2,300 mg a day. If you have hypertension, you may need to reduce your sodium intake to 1,500 mg a day.  When on the DASH eating plan, aim to eat more fresh fruits and vegetables, whole grains, lean proteins, low-fat dairy, and heart-healthy fats.  Work with your health care provider or diet and nutrition specialist (dietitian) to adjust your eating plan to your   individual calorie needs. This information is not intended to replace advice given to you by your health care provider. Make sure you discuss any questions you have with your health care provider. Document Revised: 08/23/2017 Document Reviewed: 09/03/2016 Elsevier Patient Education  2020 Elsevier Inc.  

## 2020-02-11 LAB — COMPREHENSIVE METABOLIC PANEL
ALT: 7 IU/L (ref 0–32)
AST: 12 IU/L (ref 0–40)
Albumin/Globulin Ratio: 1.5 (ref 1.2–2.2)
Albumin: 3.4 g/dL — ABNORMAL LOW (ref 3.8–4.9)
Alkaline Phosphatase: 110 IU/L (ref 48–121)
BUN/Creatinine Ratio: 12 (ref 9–23)
BUN: 9 mg/dL (ref 6–24)
Bilirubin Total: 0.4 mg/dL (ref 0.0–1.2)
CO2: 23 mmol/L (ref 20–29)
Calcium: 9 mg/dL (ref 8.7–10.2)
Chloride: 102 mmol/L (ref 96–106)
Creatinine, Ser: 0.78 mg/dL (ref 0.57–1.00)
GFR calc Af Amer: 96 mL/min/{1.73_m2} (ref >59)
GFR calc non Af Amer: 83 mL/min/{1.73_m2} (ref >59)
Globulin, Total: 2.3 g/dL (ref 1.5–4.5)
Glucose: 96 mg/dL (ref 65–99)
Potassium: 4.1 mmol/L (ref 3.5–5.2)
Sodium: 138 mmol/L (ref 134–144)
Total Protein: 5.7 g/dL — ABNORMAL LOW (ref 6.0–8.5)

## 2020-02-11 LAB — CBC WITH DIFFERENTIAL/PLATELET
Basophils Absolute: 0.1 10*3/uL (ref 0.0–0.2)
Basos: 1 %
EOS (ABSOLUTE): 0.3 10*3/uL (ref 0.0–0.4)
Eos: 4 %
Hematocrit: 40.1 % (ref 34.0–46.6)
Hemoglobin: 13.3 g/dL (ref 11.1–15.9)
Immature Grans (Abs): 0 10*3/uL (ref 0.0–0.1)
Immature Granulocytes: 0 %
Lymphocytes Absolute: 1.6 10*3/uL (ref 0.7–3.1)
Lymphs: 18 %
MCH: 29.1 pg (ref 26.6–33.0)
MCHC: 33.2 g/dL (ref 31.5–35.7)
MCV: 88 fL (ref 79–97)
Monocytes Absolute: 0.7 10*3/uL (ref 0.1–0.9)
Monocytes: 8 %
Neutrophils Absolute: 6.2 10*3/uL (ref 1.4–7.0)
Neutrophils: 69 %
Platelets: 407 10*3/uL (ref 150–450)
RBC: 4.57 x10E6/uL (ref 3.77–5.28)
RDW: 12 % (ref 11.7–15.4)
WBC: 9 10*3/uL (ref 3.4–10.8)

## 2020-02-11 LAB — TSH: TSH: 0.543 u[IU]/mL (ref 0.450–4.500)

## 2020-02-12 ENCOUNTER — Telehealth: Payer: Self-pay | Admitting: *Deleted

## 2020-02-12 NOTE — Telephone Encounter (Signed)
-----   Message from Tamsen Roers, [place] sent at 02/12/2020  9:29 AM EDT ----- Blood tests essentially normal. Continue to limit salt, stop "Stackers", decrease smoking, stop any ETOH and take Lisinopril daily. Recheck BP after being on Lisinopril for a month.

## 2020-02-12 NOTE — Telephone Encounter (Signed)
LMOVM for pt to return call 

## 2020-02-17 NOTE — Telephone Encounter (Signed)
LMTCB, PEC Triage Nurse may give patient results  

## 2020-02-18 NOTE — Telephone Encounter (Signed)
Pt given result per Dr Sherrie Mustache, "Blood tests essentially normal. Continue to limit salt, stop "Stackers", decrease smoking, stop any ETOH and take Lisinopril daily. Recheck BP after being on Lisinopril for a month"; she verbalized understanding; will route to office for notification.

## 2020-02-18 NOTE — Telephone Encounter (Signed)
Attempted to call pt.  Left vm to return call to discuss lab results.

## 2020-02-23 DIAGNOSIS — K631 Perforation of intestine (nontraumatic): Secondary | ICD-10-CM

## 2020-02-23 HISTORY — DX: Perforation of intestine (nontraumatic): K63.1

## 2020-02-25 ENCOUNTER — Ambulatory Visit: Payer: Self-pay | Admitting: *Deleted

## 2020-02-25 NOTE — Telephone Encounter (Signed)
Message from Geronimo Boot sent at 02/25/2020 3:12 PM EDT  Summary: Clinical Advice   Patient is experiencing loss of appetite,weight loss, and dry mouth. Patient would like to know if this is side effects of lisinopril. Please advise          Called patient , no answer and left message to call back.

## 2020-02-25 NOTE — Telephone Encounter (Signed)
Pt. Reports she started lisinopril 1 week ago and immediately started having loss of appetite, dry mouth, increased thirst,has lost 3 lbs. Voiding more at night. Feels like she is having side effects from lisinopril. Please advise pt. Answer Assessment - Initial Assessment Questions 1.   NAME of MEDICATION: "What medicine are you calling about?"     Lisinopril  2.   QUESTION: "What is your question?"     Having loss of appetite, weight loss, thirsty, voiding more at night 3.   PRESCRIBING HCP: "Who prescribed it?" Reason: if prescribed by specialist, call should be referred to that group.     Dr. Sherrie Mustache 4. SYMPTOMS: "Do you have any symptoms?"     Yes. See above 5. SEVERITY: If symptoms are present, ask "Are they mild, moderate or severe?"     Moderate 6.  PREGNANCY:  "Is there any chance that you are pregnant?" "When was your last menstrual period?"     No  Protocols used: MEDICATION QUESTION CALL-A-AH

## 2020-02-26 NOTE — Telephone Encounter (Signed)
Patient is calling back to see if PCP has had chance to comment on call yesterday. Told patient would alert them to your follow up call.

## 2020-02-26 NOTE — Telephone Encounter (Signed)
LMTCB 02/26/2020.  PEC Please advise pt as below.   Thanks,   -Vernona Rieger

## 2020-02-26 NOTE — Telephone Encounter (Signed)
Attempted to reach pt, left VM to CB. 

## 2020-02-26 NOTE — Telephone Encounter (Signed)
It does have mild diuretic affect which usually improves after a few weeks Would recommend she increase fluid intake to compensate.

## 2020-02-27 ENCOUNTER — Other Ambulatory Visit: Payer: Self-pay

## 2020-02-27 ENCOUNTER — Emergency Department
Admission: EM | Admit: 2020-02-27 | Discharge: 2020-02-27 | Disposition: A | Discharge status: Home / Self Care | Payer: 59 | Attending: Emergency Medicine | Admitting: Emergency Medicine

## 2020-02-27 DIAGNOSIS — R531 Weakness: Secondary | ICD-10-CM | POA: Insufficient documentation

## 2020-02-27 DIAGNOSIS — Z5321 Procedure and treatment not carried out due to patient leaving prior to being seen by health care provider: Secondary | ICD-10-CM | POA: Diagnosis not present

## 2020-02-27 HISTORY — PX: COLECTOMY WITH COLOSTOMY CREATION/HARTMANN PROCEDURE: SHX6598

## 2020-02-27 LAB — CBC
HCT: 43.4 % (ref 36.0–46.0)
Hemoglobin: 14.4 g/dL (ref 12.0–15.0)
MCH: 28.9 pg (ref 26.0–34.0)
MCHC: 33.2 g/dL (ref 30.0–36.0)
MCV: 87 fL (ref 80.0–100.0)
Platelets: 516 10*3/uL — ABNORMAL HIGH (ref 150–400)
RBC: 4.99 MIL/uL (ref 3.87–5.11)
RDW: 12.2 % (ref 11.5–15.5)
WBC: 15.2 10*3/uL — ABNORMAL HIGH (ref 4.0–10.5)
nRBC: 0 % (ref 0.0–0.2)

## 2020-02-27 LAB — BASIC METABOLIC PANEL
Anion gap: 15 (ref 5–15)
BUN: 14 mg/dL (ref 6–20)
CO2: 22 mmol/L (ref 22–32)
Calcium: 8.9 mg/dL (ref 8.9–10.3)
Chloride: 96 mmol/L — ABNORMAL LOW (ref 98–111)
Creatinine, Ser: 1 mg/dL (ref 0.44–1.00)
GFR calc Af Amer: >60 mL/min (ref >60)
GFR calc non Af Amer: >60 mL/min (ref >60)
Glucose, Bld: 170 mg/dL — ABNORMAL HIGH (ref 70–99)
Potassium: 3.5 mmol/L (ref 3.5–5.1)
Sodium: 133 mmol/L — ABNORMAL LOW (ref 135–145)

## 2020-02-27 MED ORDER — FENTANYL CITRATE (PF) 100 MCG/2ML IJ SOLN
50.0000 ug | Freq: Once | INTRAMUSCULAR | Status: AC
  Administered 2020-02-27: 50 ug via INTRAVENOUS
  Filled 2020-02-27: qty 2

## 2020-02-27 MED ORDER — ONDANSETRON HCL 4 MG/2ML IJ SOLN
4.0000 mg | Freq: Once | INTRAMUSCULAR | Status: AC
  Administered 2020-02-27: 4 mg via INTRAVENOUS
  Filled 2020-02-27: qty 2

## 2020-02-27 MED ORDER — SODIUM CHLORIDE 0.9% FLUSH: 3.0000 mL | Freq: Once | INTRAVENOUS | Status: DC

## 2020-02-27 NOTE — ED Triage Notes (Signed)
First RN Note: Pt presents to ED via POV s/p syncopal episode this morning, upon arrival to ED pt noted to have episode of incontinence of stool, pt given paper pants to change into upon arrival to ED.

## 2020-02-27 NOTE — ED Triage Notes (Addendum)
Pt arrives via POV for reports of feeling weak and feeling like she is going to pass out. Pt reports she started taking lisinopril 1 week ago and her symptoms started 2 days after starting med. Pt appears uncomfortable in triage gripping at her stomach. Pt had one episode of diarrhea with this RN before starting triage process. Pt bent over holding stomach at this time. A&Ox4. Pt denies lip swelling or trouble swallowing.

## 2020-02-27 NOTE — ED Notes (Signed)
Pt presentation discussed with Dr. Colon Branch, verbal order given for fentanyl IV stat and zofran 4mg  IV stat

## 2020-02-29 ENCOUNTER — Encounter: Payer: Self-pay | Admitting: Family Medicine

## 2020-02-29 MED ORDER — ONDANSETRON HCL 4 MG/2ML IJ SOLN
4.00 | INTRAMUSCULAR | Status: DC
Start: ? — End: 2020-02-29

## 2020-02-29 MED ORDER — PHENOL 1.4 % MT LIQD
2.00 | OROMUCOSAL | Status: DC
Start: ? — End: 2020-02-29

## 2020-02-29 MED ORDER — PROMETHAZINE HCL 25 MG/ML IJ SOLN
12.50 | INTRAMUSCULAR | Status: DC
Start: ? — End: 2020-02-29

## 2020-02-29 MED ORDER — HEPARIN SODIUM (PORCINE) 5000 UNIT/ML IJ SOLN
5000.00 | INTRAMUSCULAR | Status: DC
Start: 2020-02-29 — End: 2020-02-29

## 2020-02-29 MED ORDER — GENERIC EXTERNAL MEDICATION
1.00 | Status: DC
Start: ? — End: 2020-02-29

## 2020-02-29 MED ORDER — DEXTROSE-SODIUM CHLORIDE 5-0.45 % IV SOLN
50.00 | INTRAVENOUS | Status: DC
Start: ? — End: 2020-02-29

## 2020-02-29 MED ORDER — PANTOPRAZOLE SODIUM 40 MG IV SOLR
40.00 | INTRAVENOUS | Status: DC
Start: 2020-03-01 — End: 2020-02-29

## 2020-02-29 MED ORDER — TRAMADOL HCL 50 MG PO TABS
50.00 | ORAL_TABLET | ORAL | Status: DC
Start: ? — End: 2020-02-29

## 2020-02-29 MED ORDER — ACETAMINOPHEN 325 MG PO TABS
650.00 | ORAL_TABLET | ORAL | Status: DC
Start: 2020-02-29 — End: 2020-02-29

## 2020-02-29 MED ORDER — NICOTINE 14 MG/24HR TD PT24
1.00 | MEDICATED_PATCH | TRANSDERMAL | Status: DC
Start: 2020-03-01 — End: 2020-02-29

## 2020-02-29 MED ORDER — PIPERACILLIN SOD-TAZOBACTAM SO 3.375 (3-0.375) G IV SOLR
3.38 | INTRAVENOUS | Status: DC
Start: 2020-02-29 — End: 2020-02-29

## 2020-02-29 NOTE — Telephone Encounter (Signed)
Left message for patient to call back for PCP response to concerns.

## 2020-02-29 NOTE — Telephone Encounter (Signed)
Left VM for patient to call back and discuss PCP note.  CM

## 2021-03-06 ENCOUNTER — Encounter: Payer: Self-pay | Admitting: Family Medicine

## 2021-03-06 DIAGNOSIS — C189 Malignant neoplasm of colon, unspecified: Secondary | ICD-10-CM | POA: Insufficient documentation

## 2021-03-06 DIAGNOSIS — Z86010 Personal history of colonic polyps: Secondary | ICD-10-CM | POA: Insufficient documentation

## 2022-09-28 ENCOUNTER — Encounter: Payer: Self-pay | Admitting: Family Medicine

## 2022-09-28 ENCOUNTER — Ambulatory Visit: Payer: 59 | Admitting: Family Medicine

## 2022-09-28 VITALS — BP 143/88 | HR 80 | Temp 98.4°F | Resp 16 | Ht 60.0 in | Wt 145.0 lb

## 2022-09-28 DIAGNOSIS — M722 Plantar fascial fibromatosis: Secondary | ICD-10-CM

## 2022-09-28 MED ORDER — PREDNISONE 50 MG PO TABS: 50.0000 mg | ORAL_TABLET | Freq: Every day | ORAL | 0 refills | Status: DC

## 2022-09-28 MED ORDER — GABAPENTIN 600 MG PO TABS: 600.0000 mg | ORAL_TABLET | Freq: Every day | ORAL | 1 refills | Status: DC

## 2022-09-28 MED ORDER — MELOXICAM 15 MG PO TABS: 15.0000 mg | ORAL_TABLET | Freq: Every day | ORAL | 1 refills | Status: DC

## 2022-09-28 NOTE — Progress Notes (Signed)
     I,[name],acting as a scribe for [name], FNP.,have documented all relevant documentation on the behalf of [name], FNP,as directed by  [name], FNP while in the presence of [name], FNP.   Established patient visit   Patient: Gina Kidd   DOB: [birth year]   62 y.o. Female  MRN: [id number] Visit Date: 09/28/2022  Today's healthcare provider: [name], FNP  Introduced to nurse practitioner role and practice setting.  All questions answered.  Discussed provider/patient relationship and expectations.  Chief Complaint  Patient presents with   Foot Pain   Subjective    HPI  Pain  She reports new onset Right foot pain pain. was not an injury that may have caused the pain. The pain started more than a week and is gradually worsening. The pain does not radiate . The pain is described as aching, burning, sharp, soreness, stabbing, and throbbing, is 8/10 in intensity, occurring constantly. Symptoms are worse in the: morning and night time Aggravating factors: walking Relieving factors: none.  She has tried application of heat and application of ice with no relief. She is wearing a foot sleeve.    Medications: Outpatient Medications Prior to Visit  Medication Sig   lisinopril (ZESTRIL) 5 MG tablet Take 1 tablet (5 mg total) by mouth daily. (Patient not taking: Reported on 09/28/2022)   Multiple Vitamins-Minerals (MULTIVITAMIN ADULT PO) Take 1 tablet by mouth daily. (Patient not taking: Reported on 09/28/2022)   [DISCONTINUED] meloxicam (MOBIC) 15 MG tablet Take 1 tablet (15 mg total) by mouth daily. (Patient not taking: Reported on 02/08/2020)   No facility-administered medications prior to visit.    Review of Systems    Objective    BP (!) 143/88 (BP Location: Left Arm, Patient Position: Sitting, Cuff Size: Normal)   Pulse 80   Temp 98.4 F (36.9 C) (Oral)   Resp 16   Ht 5' (1.524 m)   Wt 145 lb (65.8 kg)   BMI 28.32 kg/m   Physical  Exam   No results found for any visits on 09/28/22.  Assessment & Plan     Problem List Items Addressed This Visit       Musculoskeletal and Integument   Plantar fasciitis of right foot - Primary    Acute, stable Not improved with current stretching, change in shoes, or use of ice or brace Will refer for possible injections Start daily NSAID with gabapentin to help qHS; recommend prednisone burst to assist Printed exercises provided        Relevant Medications   meloxicam (MOBIC) 15 MG tablet   predniSONE (DELTASONE) 50 MG tablet   gabapentin (NEURONTIN) 600 MG tablet   Other Relevant Orders   Ambulatory referral to Podiatry   Return if symptoms worsen or fail to improve.     [name], FNP, have reviewed all documentation for this visit. The documentation on 09/28/22 for the exam, diagnosis, procedures, and orders are all accurate and complete.  [name], [place] [phone] (phone) [phone] (fax)  [place]

## 2022-09-28 NOTE — Patient Instructions (Signed)
Start Prednisone 09/29/22 with morning meal Take Mobic with food Take gabapentin at night before bed Podiatry will call

## 2022-09-28 NOTE — Assessment & Plan Note (Signed)
Acute, stable Not improved with current stretching, change in shoes, or use of ice or brace Will refer for possible injections Start daily NSAID with gabapentin to help qHS; recommend prednisone burst to assist Printed exercises provided

## 2022-11-02 ENCOUNTER — Encounter: Payer: Self-pay | Admitting: Family Medicine

## 2022-11-02 ENCOUNTER — Ambulatory Visit (INDEPENDENT_AMBULATORY_CARE_PROVIDER_SITE_OTHER): Payer: 59 | Admitting: Family Medicine

## 2022-11-02 ENCOUNTER — Other Ambulatory Visit (HOSPITAL_COMMUNITY)
Admission: RE | Admit: 2022-11-02 | Discharge: 2022-11-02 | Disposition: A | Discharge status: Home / Self Care | Payer: 59 | Source: Ambulatory Visit | Attending: Family Medicine | Admitting: Family Medicine

## 2022-11-02 VITALS — BP 166/104 | HR 73 | Temp 97.7°F | Ht 60.0 in | Wt 143.0 lb

## 2022-11-02 DIAGNOSIS — R739 Hyperglycemia, unspecified: Secondary | ICD-10-CM

## 2022-11-02 DIAGNOSIS — Z1231 Encounter for screening mammogram for malignant neoplasm of breast: Secondary | ICD-10-CM | POA: Insufficient documentation

## 2022-11-02 DIAGNOSIS — Z0000 Encounter for general adult medical examination without abnormal findings: Secondary | ICD-10-CM | POA: Diagnosis not present

## 2022-11-02 DIAGNOSIS — I10 Essential (primary) hypertension: Secondary | ICD-10-CM | POA: Insufficient documentation

## 2022-11-02 DIAGNOSIS — Z86010 Personal history of colonic polyps: Secondary | ICD-10-CM | POA: Diagnosis not present

## 2022-11-02 DIAGNOSIS — Z124 Encounter for screening for malignant neoplasm of cervix: Secondary | ICD-10-CM | POA: Insufficient documentation

## 2022-11-02 DIAGNOSIS — Z114 Encounter for screening for human immunodeficiency virus [HIV]: Secondary | ICD-10-CM | POA: Insufficient documentation

## 2022-11-02 DIAGNOSIS — F17200 Nicotine dependence, unspecified, uncomplicated: Secondary | ICD-10-CM | POA: Insufficient documentation

## 2022-11-02 MED ORDER — LOSARTAN POTASSIUM-HCTZ 100-25 MG PO TABS: 1.0000 | ORAL_TABLET | Freq: Every day | ORAL | 1 refills | Status: DC

## 2022-11-02 NOTE — Assessment & Plan Note (Signed)
Followed by [facility]; goes back 5/24 for f/u CT scan Notes some dietary issues with diarrhea since resection; has a difficult time going out to eat

## 2022-11-02 NOTE — Assessment & Plan Note (Signed)
Has one broken tooth on dentures- may need a new pair Denies vision issues Things to do to keep yourself healthy  - Exercise at least 30-45 minutes a day, 3-4 days a week.  - Eat a low-fat diet with lots of fruits and vegetables, up to 7-9 servings per day.  - Seatbelts can save your life. Wear them always.  - Smoke detectors on every level of your home, check batteries every year.  - Eye Doctor - have an eye exam every 1-2 years  - Safe sex - if you may be exposed to STDs, use a condom.  - Alcohol -  If you drink, do it moderately, less than 2 drinks per day.  - [place]. Choose someone to speak for you if you are not able.  - Depression is common in our stressful world.If you're feeling down or losing interest in things you normally enjoy, please come in for a visit.  - Violence - If anyone is threatening or hurting you, please call immediately.

## 2022-11-02 NOTE — Assessment & Plan Note (Signed)
Low risk screen ?Consented; encouraged to "know your status" ?Recommend repeat screen if risk factors change ? ?

## 2022-11-02 NOTE — Assessment & Plan Note (Signed)
Chronic, unchanged Recommend lung CT for preventive screening; declines cessation efforts at this time

## 2022-11-02 NOTE — Assessment & Plan Note (Signed)
Previously normal PAP; denies current complaints

## 2022-11-02 NOTE — Patient Instructions (Addendum)
The CDC recommends two doses of Shingrix (the new shingles vaccine) separated by 2 to 6 months for adults age 63 years and older. I recommend checking with your insurance plan regarding coverage for this vaccine.    Please call and schedule your mammogram:  [facility]  [place], [place],  [place]  [address] Get Driving Directions Main: [phone]  Sunday:Closed Monday:7:20 AM - 5:00 PM Tuesday:7:20 AM - 5:00 PM Wednesday:7:20 AM - 5:00 PM Thursday:7:20 AM - 5:00 PM Friday:7:20 AM - 4:30 PM Saturday:Closed

## 2022-11-02 NOTE — Assessment & Plan Note (Signed)
Has been untreated recently; recommend new start of hyzaar 100-25 with 1 month f/u Obtain cbc and cmp Declines assistance with smoking cessation

## 2022-11-02 NOTE — Progress Notes (Signed)
Complete physical exam  Patient: Gina Kidd   DOB: [birth year]   62 y.o. Female  MRN: [id number] Visit Date: 11/02/2022  Today's healthcare provider: [name], FNP  Introduced to nurse practitioner role and practice setting.  All questions answered.  Discussed provider/patient relationship and expectations.  Subjective    Gina Kidd is a 62 y.o. female who presents today for a complete physical exam.  She reports consuming a general diet. The patient does not participate in regular exercise at present. She generally feels fairly well. She reports sleeping fairly well. She does have additional problems to discuss today.   HPI   Past Medical History:  Diagnosis Date   History of chicken pox    History of mumps    Perforated sigmoid colon ([place]) 02/2020   Colectomy [facility] 02/27/2020   Vitamin D deficiency    Past Surgical History:  Procedure Laterality Date   CESAREAN SECTION  [phone]   COLECTOMY WITH COLOSTOMY CREATION/HARTMANN PROCEDURE  02/27/2020   [facility] for perforated sigmoid colon   TUBAL LIGATION     Social History   Socioeconomic History   Marital status: Married    Spouse name: Not on file   Number of children: 3   Years of education: Not on file   Highest education level: Not on file  Occupational History   Occupation: CNA  Tobacco Use   Smoking status: Every Day    Packs/day: 0.50    Years: 21.00    Total pack years: 10.50    Types: Cigarettes   Smokeless tobacco: Never   Tobacco comments:    start late 32s. smoked 1 ppd until mid 25s, then 1/2 ppd  Vaping Use   Vaping Use: Never used  Substance and Sexual Activity   Alcohol use: No    Alcohol/week: 0.0 standard drinks of alcohol   Drug use: No   Sexual activity: Not on file  Other Topics Concern   Not on file  Social History Narrative   Not on file   Social Determinants of Health   Financial Resource Strain: Not on file  Food Insecurity: Not on file  Transportation Needs: Not on  file  Physical Activity: Not on file  Stress: Not on file  Social Connections: Not on file  Intimate Partner Violence: Not on file   Family Status  Relation Name Status   Mother  Alive       DDD   Father  Deceased at age 11's   Sister  35   Brother  [place]   Sister  Alive   Sister  [place]  (Not Specified)   Field seismologist  (Not Specified)   Family History  Problem Relation Age of Onset   Melanoma Mother    Hypertension Father    CAD Father    Congestive Heart Failure Father    Diabetes Father    Diabetes Brother    Hypertension Brother    Diabetes Sister    COPD Sister    Breast cancer Maternal Aunt    Breast cancer Paternal Aunt    No Known Allergies  Patient Care Team: Birdie Sons, MD as PCP - General (Family Medicine)   Medications: Outpatient Medications Prior to Visit  Medication Sig   gabapentin (NEURONTIN) 600 MG tablet Take 1 tablet (600 mg total) by mouth at bedtime. (Patient not taking: Reported on 11/02/2022)   Multiple Vitamins-Minerals (MULTIVITAMIN ADULT PO) Take 1  tablet by mouth daily. (Patient not taking: Reported on 09/28/2022)   [DISCONTINUED] lisinopril (ZESTRIL) 5 MG tablet Take 1 tablet (5 mg total) by mouth daily. (Patient not taking: Reported on 09/28/2022)   [DISCONTINUED] meloxicam (MOBIC) 15 MG tablet Take 1 tablet (15 mg total) by mouth daily.   [DISCONTINUED] predniSONE (DELTASONE) 50 MG tablet Take 1 tablet (50 mg total) by mouth daily with breakfast.   No facility-administered medications prior to visit.    Review of Systems  Last CBC Lab Results  Component Value Date   WBC 15.2 (H) 02/27/2020   HGB 14.4 02/27/2020   HCT 43.4 02/27/2020   MCV 87.0 02/27/2020   MCH 28.9 02/27/2020   RDW 12.2 02/27/2020   PLT 516 (H) [id number]   Last metabolic panel Lab Results  Component Value Date   GLUCOSE 170 (H) 02/27/2020   NA 133 (L) 02/27/2020   K 3.5 02/27/2020   CL 96 (L) 02/27/2020   CO2 22 02/27/2020    BUN 14 02/27/2020   CREATININE 1.00 02/27/2020   GFRNONAA >60 02/27/2020   CALCIUM 8.9 02/27/2020   PROT 5.7 (L) 02/10/2020   ALBUMIN 3.4 (L) 02/10/2020   LABGLOB 2.3 02/10/2020   AGRATIO 1.5 02/10/2020   BILITOT 0.4 02/10/2020   ALKPHOS 110 02/10/2020   AST 12 02/10/2020   ALT 7 02/10/2020   ANIONGAP 15 02/27/2020   Last lipids Lab Results  Component Value Date   CHOL 149 09/14/2015   HDL 49 09/14/2015   LDLCALC 79 09/14/2015   TRIG 105 09/14/2015   CHOLHDL 3.0 09/14/2015   Last thyroid functions Lab Results  Component Value Date   TSH 0.543 02/10/2020    Objective    BP (!) 166/104   Pulse 73   Temp 97.7 F (36.5 C)   Ht 5' (1.524 m)   Wt 143 lb (64.9 kg)   BMI 27.93 kg/m   BP Readings from Last 3 Encounters:  11/02/22 (!) 166/104  09/28/22 (!) 143/88  02/08/20 (!) 154/92   Wt Readings from Last 3 Encounters:  11/02/22 143 lb (64.9 kg)  09/28/22 145 lb (65.8 kg)  02/08/20 119 lb (54 kg)   SpO2 Readings from Last 3 Encounters:  02/08/20 99%  07/14/19 97%  03/06/18 97%   Physical Exam Vitals and nursing note reviewed. Exam conducted with a chaperone present.  Constitutional:      General: She is awake. She is not in acute distress.    Appearance: Normal appearance. She is well-developed, well-groomed and overweight. She is not ill-appearing, toxic-appearing or diaphoretic.  HENT:     Head: Normocephalic and atraumatic.     Jaw: There is normal jaw occlusion. No trismus, tenderness, swelling or pain on movement.     Right Ear: Hearing, tympanic membrane, ear canal and external ear normal. There is no impacted cerumen.     Left Ear: Hearing, tympanic membrane, ear canal and external ear normal. There is no impacted cerumen.     Nose: Nose normal. No congestion or rhinorrhea.     Right Turbinates: Not enlarged, swollen or pale.     Left Turbinates: Not enlarged, swollen or pale.     Right Sinus: No maxillary sinus tenderness or frontal sinus  tenderness.     Left Sinus: No maxillary sinus tenderness or frontal sinus tenderness.     Mouth/Throat:     Lips: Pink.     Mouth: Mucous membranes are moist. No injury.     Tongue: No lesions.  Pharynx: Oropharynx is clear. Uvula midline. No pharyngeal swelling, oropharyngeal exudate, posterior oropharyngeal erythema or uvula swelling.     Tonsils: No tonsillar exudate or tonsillar abscesses.  Eyes:     General: Lids are normal. Lids are everted, no foreign bodies appreciated. Vision grossly intact. Gaze aligned appropriately. No allergic shiner or visual field deficit.       Right eye: No discharge.        Left eye: No discharge.     Extraocular Movements: Extraocular movements intact.     Conjunctiva/sclera: Conjunctivae normal.     Right eye: Right conjunctiva is not injected. No exudate.    Left eye: Left conjunctiva is not injected. No exudate.    Pupils: Pupils are equal, round, and reactive to light.  Neck:     Thyroid: No thyroid mass, thyromegaly or thyroid tenderness.     Vascular: No carotid bruit.     Trachea: Trachea normal.  Cardiovascular:     Rate and Rhythm: Normal rate and regular rhythm.     Pulses: Normal pulses.          Carotid pulses are 2+ on the right side and 2+ on the left side.      Radial pulses are 2+ on the right side and 2+ on the left side.       Dorsalis pedis pulses are 2+ on the right side and 2+ on the left side.       Posterior tibial pulses are 2+ on the right side and 2+ on the left side.     Heart sounds: Normal heart sounds, S1 normal and S2 normal. No murmur heard.    No friction rub. No gallop.  Pulmonary:     Effort: Pulmonary effort is normal. No respiratory distress.     Breath sounds: Normal breath sounds and air entry. No stridor. No wheezing, rhonchi or rales.  Chest:     Chest wall: No tenderness.     Comments: Breasts: breasts appear normal, no suspicious masses, no skin or nipple changes or axillary nodes, right breast  normal without mass, skin or nipple changes or axillary nodes, left breast normal without mass, skin or nipple changes or axillary nodes, risk and benefit of breast self-exam was discussed  Abdominal:     General: Abdomen is flat. Bowel sounds are normal. There is no distension.     Palpations: Abdomen is soft. There is no mass.     Tenderness: There is no abdominal tenderness. There is no right CVA tenderness, left CVA tenderness, guarding or rebound.     Hernia: No hernia is present. There is no hernia in the left inguinal area or right inguinal area.  Genitourinary:    General: Normal vulva.     Tanner stage (genital): 5.     Vagina: Normal.     Cervix: Normal.     Uterus: Normal.      Adnexa: Right adnexa normal and left adnexa normal.     Comments: PAP completed  Musculoskeletal:        General: No swelling, tenderness, deformity or signs of injury. Normal range of motion.     Cervical back: Full passive range of motion without pain, normal range of motion and neck supple. No edema, rigidity or tenderness. No muscular tenderness.     Right lower leg: No edema.     Left lower leg: No edema.  Lymphadenopathy:     Cervical: No cervical adenopathy.     Right cervical: No  superficial, deep or posterior cervical adenopathy.    Left cervical: No superficial, deep or posterior cervical adenopathy.     Lower Body: No right inguinal adenopathy. No left inguinal adenopathy.  Skin:    General: Skin is warm and dry.     Capillary Refill: Capillary refill takes less than 2 seconds.     Coloration: Skin is not jaundiced or pale.     Findings: No bruising, erythema, lesion or rash.  Neurological:     General: No focal deficit present.     Mental Status: She is alert and oriented to person, place, and time. Mental status is at baseline.     GCS: GCS eye subscore is 4. GCS verbal subscore is 5. GCS motor subscore is 6.     Sensory: Sensation is intact. No sensory deficit.     Motor: Motor  function is intact. No weakness.     Coordination: Coordination is intact. Coordination normal.     Gait: Gait is intact. Gait normal.  Psychiatric:        Attention and Perception: Attention and perception normal.        Mood and Affect: Mood and affect normal.        Speech: Speech normal.        Behavior: Behavior normal. Behavior is cooperative.        Thought Content: Thought content normal.        Cognition and Memory: Cognition and memory normal.        Judgment: Judgment normal.     Last depression screening scores    11/02/2022    8:45 AM 09/28/2022   11:20 AM 07/14/2019    8:12 AM  PHQ 2/9 Scores  PHQ - 2 Score 0 0 0  PHQ- 9 Score 1 0    Last fall risk screening    11/02/2022    8:46 AM  Fall Risk   Falls in the past year? 0  Number falls in past yr: 0  Injury with Fall? 0   Last Audit-C alcohol use screening    11/02/2022    8:45 AM  Alcohol Use Disorder Test (AUDIT)  1. How often do you have a drink containing alcohol? 0  2. How many drinks containing alcohol do you have on a typical day when you are drinking? 0  3. How often do you have six or more drinks on one occasion? 0  AUDIT-C Score 0   A score of 3 or more in women, and 4 or more in men indicates increased risk for alcohol abuse, EXCEPT if all of the points are from question 1   No results found for any visits on 11/02/22.  Assessment & Plan    Routine Health Maintenance and Physical Exam  Exercise Activities and Dietary recommendations  Goals   None     Immunization History  Administered Date(s) Administered   Influenza-Unspecified 06/24/2019   Moderna Sars-Covid-2 Vaccination 10/06/2019, 11/02/2019   Pneumococcal Polysaccharide-23 07/20/2014    Health Maintenance  Topic Date Due   DTaP/Tdap/Td (1 - Tdap) Never done   Zoster Vaccines- Shingrix (1 of 2) Never done   PAP SMEAR-Modifier  09/13/2018   COVID-19 Vaccine (3 - Moderna risk series) 11/30/2019   MAMMOGRAM  08/10/2021    COLONOSCOPY (Pts 45-72yr Insurance coverage will need to be confirmed)  03/27/2023   INFLUENZA VACCINE  Completed   Hepatitis C Screening  Completed   HIV Screening  Completed   HPV VACCINES  Aged Out  Discussed health benefits of physical activity, and encouraged her to engage in regular exercise appropriate for her age and condition.  Problem List Items Addressed This Visit       Cardiovascular and Mediastinum   Primary hypertension    Has been untreated recently; recommend new start of hyzaar 100-25 with 1 month f/u Obtain cbc and cmp Declines assistance with smoking cessation       Relevant Medications   losartan-hydrochlorothiazide (HYZAAR) 100-25 MG tablet     Other   Annual physical exam - Primary    Has one broken tooth on dentures- may need a new pair Denies vision issues Things to do to keep yourself healthy  - Exercise at least 30-45 minutes a day, 3-4 days a week.  - Eat a low-fat diet with lots of fruits and vegetables, up to 7-9 servings per day.  - Seatbelts can save your life. Wear them always.  - Smoke detectors on every level of your home, check batteries every year.  - Eye Doctor - have an eye exam every 1-2 years  - Safe sex - if you may be exposed to STDs, use a condom.  - Alcohol -  If you drink, do it moderately, less than 2 drinks per day.  - [place]. Choose someone to speak for you if you are not able.  - Depression is common in our stressful world.If you're feeling down or losing interest in things you normally enjoy, please come in for a visit.  - Violence - If anyone is threatening or hurting you, please call immediately.       Relevant Orders   CBC with Differential/Platelet   Comprehensive Metabolic Panel (CMET)   TSH + free T4   Lipid panel   Elevated serum glucose    Check A1c Continue to recommend balanced, lower carb meals. Smaller meal size, adding snacks. Choosing water as drink of choice and increasing  purposeful exercise.       Relevant Orders   Hemoglobin A1c   Encounter for Papanicolaou smear for cervical cancer screening    Previously normal PAP; denies current complaints      Relevant Orders   Cytology - PAP   Encounter for screening for HIV    Low risk screen Consented; encouraged to "know your status" Recommend repeat screen if risk factors change       Relevant Orders   HIV antibody (with reflex)   History of adenomatous polyp of colon    Followed by [facility]; goes back 5/24 for f/u CT scan Notes some dietary issues with diarrhea since resection; has a difficult time going out to eat       Screening mammogram for breast cancer    Due for screening for mammogram, denies breast concerns, provided with phone number to call and schedule appointment for mammogram. Encouraged to repeat breast cancer screening every 1-2 years.       Relevant Orders   MM 3D SCREEN BREAST BILATERAL   Tobacco dependence    Chronic, unchanged Recommend lung CT for preventive screening; declines cessation efforts at this time       Relevant Orders   Ambulatory Referral Lung Cancer Screening [facility]   Return in about 4 weeks (around 11/30/2022) for HTN management, blood pressure check.    [name], FNP, have reviewed all documentation for this visit. The documentation on 11/02/22 for the exam, diagnosis, procedures, and orders are all accurate and complete.  Jaci Standard  Rollene Rotunda, [place] [phone] (phone) [phone] (fax)  [place]

## 2022-11-02 NOTE — Assessment & Plan Note (Signed)
Due for screening for mammogram, denies breast concerns, provided with phone number to call and schedule appointment for mammogram. Encouraged to repeat breast cancer screening every 1-2 years.  

## 2022-11-02 NOTE — Assessment & Plan Note (Signed)
Check A1c Continue to recommend balanced, lower carb meals. Smaller meal size, adding snacks. Choosing water as drink of choice and increasing purposeful exercise.

## 2022-11-03 LAB — COMPREHENSIVE METABOLIC PANEL
ALT: 14 IU/L (ref 0–32)
AST: 13 IU/L (ref 0–40)
Albumin/Globulin Ratio: 2.1 (ref 1.2–2.2)
Albumin: 4.4 g/dL (ref 3.9–4.9)
Alkaline Phosphatase: 130 IU/L — ABNORMAL HIGH (ref 44–121)
BUN/Creatinine Ratio: 12 (ref 12–28)
BUN: 9 mg/dL (ref 8–27)
Bilirubin Total: 0.5 mg/dL (ref 0.0–1.2)
CO2: 25 mmol/L (ref 20–29)
Calcium: 9.6 mg/dL (ref 8.7–10.3)
Chloride: 105 mmol/L (ref 96–106)
Creatinine, Ser: 0.76 mg/dL (ref 0.57–1.00)
Globulin, Total: 2.1 g/dL (ref 1.5–4.5)
Glucose: 110 mg/dL — ABNORMAL HIGH (ref 70–99)
Potassium: 4.5 mmol/L (ref 3.5–5.2)
Sodium: 143 mmol/L (ref 134–144)
Total Protein: 6.5 g/dL (ref 6.0–8.5)
eGFR: 89 mL/min/{1.73_m2} (ref >59)

## 2022-11-03 LAB — LIPID PANEL
Chol/HDL Ratio: 3 ratio (ref 0.0–4.4)
Cholesterol, Total: 136 mg/dL (ref 100–199)
HDL: 46 mg/dL (ref >39)
LDL Chol Calc (NIH): 71 mg/dL (ref 0–99)
Triglycerides: 103 mg/dL (ref 0–149)
VLDL Cholesterol Cal: 19 mg/dL (ref 5–40)

## 2022-11-03 LAB — CBC WITH DIFFERENTIAL/PLATELET
Basophils Absolute: 0.1 10*3/uL (ref 0.0–0.2)
Basos: 1 %
EOS (ABSOLUTE): 0.2 10*3/uL (ref 0.0–0.4)
Eos: 2 %
Hematocrit: 45.8 % (ref 34.0–46.6)
Hemoglobin: 16 g/dL — ABNORMAL HIGH (ref 11.1–15.9)
Immature Grans (Abs): 0 10*3/uL (ref 0.0–0.1)
Immature Granulocytes: 0 %
Lymphocytes Absolute: 1.9 10*3/uL (ref 0.7–3.1)
Lymphs: 23 %
MCH: 31.3 pg (ref 26.6–33.0)
MCHC: 34.9 g/dL (ref 31.5–35.7)
MCV: 90 fL (ref 79–97)
Monocytes Absolute: 0.5 10*3/uL (ref 0.1–0.9)
Monocytes: 6 %
Neutrophils Absolute: 5.9 10*3/uL (ref 1.4–7.0)
Neutrophils: 68 %
Platelets: 313 10*3/uL (ref 150–450)
RBC: 5.11 x10E6/uL (ref 3.77–5.28)
RDW: 11 % — ABNORMAL LOW (ref 11.7–15.4)
WBC: 8.6 10*3/uL (ref 3.4–10.8)

## 2022-11-03 LAB — TSH+FREE T4
Free T4: 1.31 ng/dL (ref 0.82–1.77)
TSH: 0.896 u[IU]/mL (ref 0.450–4.500)

## 2022-11-03 LAB — HIV ANTIBODY (ROUTINE TESTING W REFLEX): HIV Screen 4th Generation wRfx: NONREACTIVE

## 2022-11-03 LAB — HEMOGLOBIN A1C
Est. average glucose Bld gHb Est-mCnc: 114 mg/dL
Hgb A1c MFr Bld: 5.6 % (ref 4.8–5.6)

## 2022-11-03 NOTE — Progress Notes (Signed)
Labs relatively stable. Continue to recommend smoking cessation and blood pressure control to reduce risk of heart attack and/or stroke. Current risk calculated at 15% in 10 years.   The 10-year ASCVD risk score (Arnett DK, et al., 2019) is: 15%   Values used to calculate the score:     Age: 62 years     Sex: Female     Is Non-Hispanic African American: No     Diabetic: No     Tobacco smoker: Yes     Systolic Blood Pressure: XX123456 mmHg     Is BP treated: Yes     HDL Cholesterol: 46 mg/dL     Total Cholesterol: 136 mg/dL

## 2022-11-05 ENCOUNTER — Telehealth: Payer: Self-pay | Admitting: Family Medicine

## 2022-11-05 NOTE — Telephone Encounter (Signed)
Pt. Given results, verbalizes understanding. 

## 2022-11-06 ENCOUNTER — Telehealth: Payer: Self-pay

## 2022-11-06 NOTE — Telephone Encounter (Signed)
-----   Message from [name], FNP sent at 11/03/2022  1:47 PM EST ----- Labs relatively stable. Continue to recommend smoking cessation and blood pressure control to reduce risk of heart attack and/or stroke. Current risk calculated at 15% in 10 years.   The 10-year ASCVD risk score (Arnett DK, et al., 2019) is: 15%   Values used to calculate the score:     Age: 62 years     Sex: Female     Is Non-Hispanic African American: No     Diabetic: No     Tobacco smoker: Yes     Systolic Blood Pressure: 828 mmHg     Is BP treated: Yes     HDL Cholesterol: 46 mg/dL     Total Cholesterol: 136 mg/dL

## 2022-11-06 NOTE — Telephone Encounter (Signed)
I left a message for the patient to return my call.

## 2022-11-15 LAB — CYTOLOGY - PAP
Chlamydia: NEGATIVE
Comment: NEGATIVE
Comment: NEGATIVE
Comment: NEGATIVE
Comment: NEGATIVE
Comment: NORMAL
Diagnosis: NEGATIVE
HSV1: NEGATIVE
HSV2: NEGATIVE
High risk HPV: NEGATIVE
Neisseria Gonorrhea: NEGATIVE
Trichomonas: NEGATIVE

## 2022-11-15 NOTE — Progress Notes (Signed)
Last pap looks great; screening typically stops at 65. Repeat if symptoms change.

## 2022-11-30 ENCOUNTER — Ambulatory Visit: Payer: 59 | Admitting: Family Medicine

## 2023-05-07 ENCOUNTER — Other Ambulatory Visit: Payer: Self-pay | Admitting: Family Medicine

## 2023-05-07 DIAGNOSIS — I10 Essential (primary) hypertension: Secondary | ICD-10-CM

## 2023-11-26 ENCOUNTER — Telehealth: Payer: Self-pay | Admitting: Family Medicine

## 2023-11-26 NOTE — Telephone Encounter (Signed)
 Patient is calling in because she is returning a call from the office regarding Dr. Sherrie Mustache being out and unable to sign Jury Duty paperwork. Patient says anyone can fill it out and sign it, it just needs to come from her office.

## 2023-11-26 NOTE — Telephone Encounter (Signed)
 Copied from CRM [id number]. Topic: General - Other >> Nov 26, 2023  9:17 AM Elle L wrote: Reason for CRM: The patient is requesting to see if Dr. Sherrie Mustache could fill out a medical leave form for jury duty as she is unable to do it due to her colon. The patient's call back number is [phone]...(LVM informing pt that Dr. Sherrie Mustache is out of the office this week & this will not be taking care of until he returns. & let us know how soon this is needed.)

## 2023-11-27 NOTE — Telephone Encounter (Signed)
 Recommend patient discuss with PCP after return to office

## 2023-11-27 NOTE — Telephone Encounter (Signed)
 Pt called in says, Gina Kidd starts on 03/19, so she needs the medical form filled out before then, that excuses her

## 2023-11-28 NOTE — Telephone Encounter (Signed)
 Called but was only able to leave a voice message asking the pt to contact the office back regarding this matter. She would need to bring the form in to be review by a covering provider.

## 2024-03-30 ENCOUNTER — Ambulatory Visit: Payer: PRIVATE HEALTH INSURANCE | Admitting: Family Medicine

## 2024-03-30 ENCOUNTER — Encounter: Payer: Self-pay | Admitting: Family Medicine

## 2024-03-30 VITALS — BP 143/93 | HR 77 | Temp 98.2°F | Ht 60.0 in | Wt 134.7 lb

## 2024-03-30 DIAGNOSIS — Z23 Encounter for immunization: Secondary | ICD-10-CM | POA: Diagnosis not present

## 2024-03-30 DIAGNOSIS — Z1231 Encounter for screening mammogram for malignant neoplasm of breast: Secondary | ICD-10-CM

## 2024-03-30 DIAGNOSIS — Z0000 Encounter for general adult medical examination without abnormal findings: Secondary | ICD-10-CM | POA: Diagnosis not present

## 2024-03-30 DIAGNOSIS — E559 Vitamin D deficiency, unspecified: Secondary | ICD-10-CM

## 2024-03-30 DIAGNOSIS — F17200 Nicotine dependence, unspecified, uncomplicated: Secondary | ICD-10-CM

## 2024-03-30 DIAGNOSIS — I10 Essential (primary) hypertension: Secondary | ICD-10-CM | POA: Diagnosis not present

## 2024-03-30 DIAGNOSIS — C187 Malignant neoplasm of sigmoid colon: Secondary | ICD-10-CM

## 2024-03-30 NOTE — Patient Instructions (Addendum)
 Please review the attached list of medications and notify my office if there are any errors.   Please call the [facility] [phone]) to schedule a routine screening mammogram.

## 2024-03-30 NOTE — Progress Notes (Signed)
 Complete physical exam   Patient: Gina Kidd   DOB: 11-23-1960   63 y.o. Female  MRN: [id number] Visit Date: 03/30/2024  Today's healthcare provider: Nancyann Perry, MD   Chief Complaint  Patient presents with   Annual Exam    Patient is feeling okay over all  Exercise- Gym usually 2-3 days per week. Treadmill for 1 hour Sleep- Good   Subjective    Discussed the use of AI scribe software for clinical note transcription with the patient, who gave verbal consent to proceed.  History of Present Illness   Gina Kidd is a 62 year old female who presents for an annual physical exam.  She has a history of colon cancer s/p partial colectomy in 2021 at [facility].   Post-surgery, she has dietary restrictions, avoiding processed and restaurant foods due to fear of accidents. She experiences anxiety about eating out and has adjusted her diet accordingly. She also notes a physical sensation of a knot in her stomach indicating when she needs to use the bathroom.  Her sister recently passed away from complications related to CHF, COPD, and possibly diabetes, which has increased her concern for her own health. She wants to ensure her well-being, particularly in light of her sister's passing.  She has a history of hypertension but has not been taking her prescribed losartan , believing her elevated blood pressure was related to her cancer. She only took the medication once while hospitalized. She also discontinued gabapentin .  She smokes about a pack of cigarettes a day, having started smoking in her forties or fifties. She does not smoke at work but smokes more during her time off. she has been smoking between 1/2 ppd and 1 ppd.   She reports experiencing acid reflux twice in the past week, which she attributes to eating before bed. She has since stopped eating after 6 PM to manage this symptom.  No recent trouble with breathing or feeling short of breath.      HPI     Annual Exam     Additional comments: Patient is feeling okay over all  Exercise- Gym usually 2-3 days per week. Treadmill for 1 hour Sleep- Good      Last edited by Cherry Chiquita HERO, CMA on 03/30/2024  1:46 PM.       Past Medical History:  Diagnosis Date   History of chicken pox    History of mumps    Perforated sigmoid colon (HCC) 02/2020   Colectomy [facility] 02/27/2020   Vitamin D  deficiency    Past Surgical History:  Procedure Laterality Date   CESAREAN SECTION  [phone]   COLECTOMY WITH COLOSTOMY CREATION/HARTMANN PROCEDURE  02/27/2020   [facility] for perforated sigmoid colon   TUBAL LIGATION     Social History   Socioeconomic History   Marital status: Married    Spouse name: Not on file   Number of children: 3   Years of education: Not on file   Highest education level: Not on file  Occupational History   Occupation: CNA  Tobacco Use   Smoking status: Every Day    Current packs/day: 0.50    Average packs/day: 0.5 packs/day for 21.0 years (10.5 ttl pk-yrs)    Types: Cigarettes   Smokeless tobacco: Never   Tobacco comments:    start late 30s. smoked 1 ppd until mid 50s, then 1/2 ppd  Vaping Use   Vaping status: Never Used  Substance and Sexual Activity   Alcohol use: No  Alcohol/week: 0.0 standard drinks of alcohol   Drug use: No   Sexual activity: Not on file  Other Topics Concern   Not on file  Social History Narrative   Not on file   Social Drivers of Health   Financial Resource Strain: Low Risk  (03/30/2024)   Overall Financial Resource Strain (CARDIA)    Difficulty of Paying Living Expenses: Not hard at all  Food Insecurity: No Food Insecurity (03/30/2024)   Hunger Vital Sign    Worried About Running Out of Food in the Last Year: Never true    Ran Out of Food in the Last Year: Never true  Transportation Needs: No Transportation Needs (03/30/2024)   PRAPARE - Administrator, Civil Service (Medical): No    Lack of Transportation (Non-Medical): No  Physical  Activity: Not on file  Stress: No Stress Concern Present (03/30/2024)   [employer] of Occupational Health - Occupational Stress Questionnaire    Feeling of Stress: Only a little  Social Connections: Moderately Isolated (03/30/2024)   Social Connection and Isolation Panel    Frequency of Communication with Friends and Family: Twice a week    Frequency of Social Gatherings with Friends and Family: Twice a week    Attends Religious Services: Never    Database administrator or Organizations: No    Attends Banker Meetings: Never    Marital Status: Married  Catering manager Violence: Not At Risk (03/30/2024)   Humiliation, Afraid, Rape, and Kick questionnaire    Fear of Current or Ex-Partner: No    Emotionally Abused: No    Physically Abused: No    Sexually Abused: No   Family Status  Relation Name Status   Mother  Alive       DDD   Father  Deceased at age 41's   Sister  Alive   Sister  Alive   Sister  Deceased   Brother  Alive   Brother  Alive   Mat Aunt  (Not Specified)   Oceanographer  (Not Specified)  No partnership data on file   Family History  Problem Relation Age of Onset   Melanoma Mother    Hypertension Father    CAD Father    Congestive Heart Failure Father    Diabetes Father    Diabetes Sister    COPD Sister    Heart failure Sister    Diabetes Brother    Hypertension Brother    Breast cancer Maternal Aunt    Breast cancer Paternal Aunt    No Known Allergies  Patient Care Team: Gasper Nancyann BRAVO, MD as PCP - General (Family Medicine)   Medications: Outpatient Medications Prior to Visit  Medication Sig   gabapentin  (NEURONTIN ) 600 MG tablet Take 1 tablet (600 mg total) by mouth at bedtime. (Patient not taking: Reported on 03/30/2024)   losartan -hydrochlorothiazide (HYZAAR) 100-25 MG tablet TAKE 1 TABLET BY MOUTH EVERY DAY (Patient not taking: Reported on 03/30/2024)   Multiple Vitamins-Minerals (MULTIVITAMIN ADULT PO) Take 1 tablet by mouth daily.  (Patient not taking: Reported on 03/30/2024)   No facility-administered medications prior to visit.        Objective    BP (!) 143/93 (Patient Position: Sitting, Cuff Size: Normal)   Pulse 77   Temp 98.2 F (36.8 C) (Oral)   Ht 5' (1.524 m)   Wt 134 lb 11.2 oz (61.1 kg)   SpO2 97%   BMI 26.31 kg/m    Physical Exam  General Appearance:    Well developed, well nourished female. Alert, cooperative, in no acute distress, appears stated age   Head:    Normocephalic, without obvious abnormality, atraumatic  Eyes:    PERRL, conjunctiva/corneas clear, EOM's intact, fundi    benign, both eyes  Ears:    Normal TM's and external ear canals, both ears  Nose:   Nares normal, septum midline, mucosa normal, no drainage    or sinus tenderness  Throat:   Lips, mucosa, and tongue normal; teeth and gums normal  Neck:   Supple, symmetrical, trachea midline, no adenopathy;    thyroid:  no enlargement/tenderness/nodules; no carotid   bruit or JVD  Back:     Symmetric, no curvature, ROM normal, no CVA tenderness  Lungs:     Clear to auscultation bilaterally, respirations unlabored  Chest Wall:    No tenderness or deformity   Heart:    Normal heart rate. Normal rhythm. No murmurs, rubs, or gallops.   Breast Exam:    deferred  Abdomen:     Soft, non-tender, bowel sounds active all four quadrants,    no masses, no organomegaly  Pelvic:    deferred  Extremities:   All extremities are intact. No cyanosis or edema  Pulses:   2+ and symmetric all extremities  Skin:   Skin color, texture, turgor normal, no rashes or lesions  Lymph nodes:   Cervical, supraclavicular, and axillary nodes normal  Neurologic:   CNII-XII intact, normal strength, sensation and reflexes    throughout   EKG: Normal    Last depression screening scores    03/30/2024    1:47 PM 11/02/2022    8:45 AM 09/28/2022   11:20 AM  PHQ 2/9 Scores  PHQ - 2 Score 0 0 0  PHQ- 9 Score 0 1 0   Last fall risk screening    03/30/2024     1:46 PM  Fall Risk   Falls in the past year? 0  Number falls in past yr: 0  Injury with Fall? 0  Risk for fall due to : No Fall Risks  Follow up Falls evaluation completed   Last Audit-C alcohol use screening    11/02/2022    8:45 AM  Alcohol Use Disorder Test (AUDIT)  1. How often do you have a drink containing alcohol? 0  2. How many drinks containing alcohol do you have on a typical day when you are drinking? 0  3. How often do you have six or more drinks on one occasion? 0  AUDIT-C Score 0   A score of 3 or more in women, and 4 or more in men indicates increased risk for alcohol abuse, EXCEPT if all of the points are from question 1   No results found for any visits on 03/30/24.  Assessment & Plan    Routine Health Maintenance and Physical Exam  Exercise Activities and Dietary recommendations  Goals   None     Immunization History  Administered Date(s) Administered   Influenza Inj Mdck Quad Pf 07/03/2017, 07/02/2018   Influenza,inj,Quad PF,6+ Mos 06/24/2013, 07/21/2019, 07/27/2022   Influenza-Unspecified 06/24/2019   Moderna Sars-Covid-2 Vaccination 10/06/2019, 11/02/2019, 08/03/2020   Pneumococcal Polysaccharide-23 07/20/2014   Tdap 01/17/2011    Health Maintenance  Topic Date Due   Zoster Vaccines- Shingrix  (1 of 2) Never done   Pneumococcal Vaccine 49-76 Years old (2 of 2 - PCV) 07/21/2015   DTaP/Tdap/Td (2 - Td or Tdap) 01/16/2021   MAMMOGRAM  08/10/2021  Colonoscopy  03/27/2023   COVID-19 Vaccine (4 - 2024-25 season) 05/26/2023   INFLUENZA VACCINE  04/24/2024   Cervical Cancer Screening (HPV/Pap Cotest)  11/03/2027   Hepatitis C Screening  Completed   HIV Screening  Completed   Hepatitis B Vaccines  Aged Out   HPV VACCINES  Aged Out   Meningococcal B Vaccine  Aged Out    Discussed health benefits of physical activity, and encouraged her to engage in regular exercise appropriate for her age and condition.   2. Primary hypertension Previously  prescribed losartan -hydrochlorothiazide but did not stay on it.   - EKG 12-Lead - CBC - Comprehensive metabolic panel with GFR - Lipid panel  Consider starting low dose BP medication after reviewing labs.   3. Vitamin D  deficiency  - VITAMIN D  25 Hydroxy (Vit-D Deficiency, Fractures)  4. Tobacco dependence Late starter, close to 20 pack years. Has been having periodic abdomen/chest/pelvic CT via [facility] oncology for colon cancer surveillance. Consider LDCT screening in the future.   5. Breast cancer screening by mammogram  - MM 3D Screening Breast Bilateral; Future  6. Need for shingles vaccine  - Administer Zoster, Recombinant (Shingrix ) Vaccine  7. Need for vaccination against Streptococcus pneumoniae  - Pneumococcal conjugate vaccine 20-valent (Preferred)  8. Need for tetanus, diphtheria, and acellular pertussis (Tdap) vaccine in patient of adolescent age or older  - Administer Tetanus-diphtheria-acellular pertussis (Tdap) vaccine  9. 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

## 2024-03-31 ENCOUNTER — Ambulatory Visit: Payer: Self-pay | Admitting: Family Medicine

## 2024-03-31 LAB — COMPREHENSIVE METABOLIC PANEL WITH GFR
ALT: 13 IU/L (ref 0–32)
AST: 18 IU/L (ref 0–40)
Albumin: 4.4 g/dL (ref 3.9–4.9)
Alkaline Phosphatase: 141 IU/L — ABNORMAL HIGH (ref 44–121)
BUN/Creatinine Ratio: 15 (ref 12–28)
BUN: 12 mg/dL (ref 8–27)
Bilirubin Total: 0.3 mg/dL (ref 0.0–1.2)
CO2: 21 mmol/L (ref 20–29)
Calcium: 9.4 mg/dL (ref 8.7–10.3)
Chloride: 105 mmol/L (ref 96–106)
Creatinine, Ser: 0.82 mg/dL (ref 0.57–1.00)
Globulin, Total: 2.1 g/dL (ref 1.5–4.5)
Glucose: 93 mg/dL (ref 70–99)
Potassium: 4.4 mmol/L (ref 3.5–5.2)
Sodium: 140 mmol/L (ref 134–144)
Total Protein: 6.5 g/dL (ref 6.0–8.5)
eGFR: 80 mL/min/1.73 (ref >59)

## 2024-03-31 LAB — LIPID PANEL
Chol/HDL Ratio: 3.5 ratio (ref 0.0–4.4)
Cholesterol, Total: 140 mg/dL (ref 100–199)
HDL: 40 mg/dL (ref >39)
LDL Chol Calc (NIH): 71 mg/dL (ref 0–99)
Triglycerides: 167 mg/dL — ABNORMAL HIGH (ref 0–149)
VLDL Cholesterol Cal: 29 mg/dL (ref 5–40)

## 2024-03-31 LAB — CBC
Hematocrit: 45.7 % (ref 34.0–46.6)
Hemoglobin: 15.9 g/dL (ref 11.1–15.9)
MCH: 32.1 pg (ref 26.6–33.0)
MCHC: 34.8 g/dL (ref 31.5–35.7)
MCV: 92 fL (ref 79–97)
Platelets: 292 x10E3/uL (ref 150–450)
RBC: 4.96 x10E6/uL (ref 3.77–5.28)
RDW: 11.9 % (ref 11.7–15.4)
WBC: 9.9 x10E3/uL (ref 3.4–10.8)

## 2024-03-31 LAB — VITAMIN D 25 HYDROXY (VIT D DEFICIENCY, FRACTURES): Vit D, 25-Hydroxy: 20.9 ng/mL — ABNORMAL LOW (ref 30.0–100.0)

## 2024-06-03 ENCOUNTER — Ambulatory Visit: Payer: Self-pay

## 2024-06-03 NOTE — Telephone Encounter (Signed)
 FYI Only or Action Required?: FYI only for provider.  Patient was last seen in primary care on 03/30/2024 by Gasper Nancyann BRAVO, MD.  Called Nurse Triage reporting Hypertension.  Symptoms began several weeks ago.  Interventions attempted: Nothing.  Symptoms are: gradually worsening.  Triage Disposition: See PCP When Office is Open (Within 3 Days)  Patient/caregiver understands and will follow disposition?: Yes       Copied from CRM #[id number]. Topic: Clinical - Red Word Triage >> Jun 03, 2024  9:41 AM Antwanette L wrote: Red Word that prompted transfer to Nurse Triage: Pt is requesting bp meds. Her bp last week was153/140. The pt is feeling anxious. The pt has no medicine listed on her chart Reason for Disposition  Systolic BP >= 160 OR Diastolic >= 100  Answer Assessment - Initial Assessment Questions Patient states she feels anxious. Patient denies blurred vision, chest pain, difficulty breathing, headache, weakness, sweating or heart palpitations. Pt states she was on BP meds while in hospital. Requesting BP meds for symptoms.    1. BLOOD PRESSURE: What is your blood pressure? Did you take at least two measurements 5 minutes apart?     153/140 a couple of weeks ago ; 143/90 last week  2. ONSET: When did you take your blood pressure?     Last week.  3. HOW: How did you take your blood pressure? (e.g., automatic home BP monitor, visiting nurse)     BP monitor at home  4. HISTORY: Do you have a history of high blood pressure?     Denies  5. MEDICINES: Are you taking any medicines for blood pressure? Have you missed any doses recently?     Not currently on BP meds  6. OTHER SYMPTOMS: Do you have any symptoms? (e.g., blurred vision, chest pain, difficulty breathing, headache, weakness)     Denies  Protocols used: Blood Pressure - High-A-AH

## 2024-06-05 ENCOUNTER — Encounter: Payer: Self-pay | Admitting: Family Medicine

## 2024-06-05 ENCOUNTER — Ambulatory Visit: Payer: PRIVATE HEALTH INSURANCE | Admitting: Family Medicine

## 2024-06-05 VITALS — BP 147/99 | HR 72 | Ht 60.0 in | Wt 137.5 lb

## 2024-06-05 DIAGNOSIS — Z23 Encounter for immunization: Secondary | ICD-10-CM | POA: Diagnosis not present

## 2024-06-05 DIAGNOSIS — E559 Vitamin D deficiency, unspecified: Secondary | ICD-10-CM

## 2024-06-05 DIAGNOSIS — I10 Essential (primary) hypertension: Secondary | ICD-10-CM

## 2024-06-05 MED ORDER — LOSARTAN POTASSIUM-HCTZ 50-12.5 MG PO TABS: 1.0000 | ORAL_TABLET | Freq: Every day | ORAL | 1 refills | Status: DC

## 2024-06-05 NOTE — Progress Notes (Signed)
 Established patient visit   Patient: Gina Kidd   DOB: [birth year]   63 y.o. Female  MRN: [id number] Visit Date: 06/05/2024  Today's healthcare provider: Nancyann Perry, MD   Chief Complaint  Patient presents with   Hypertension    Patient contacted office on 09/10 and stated she feels anxious. Patient denies blurred vision, chest pain, difficulty breathing, headache, weakness, sweating or heart palpitations. Pt states she was on BP meds while in hospital. Requesting BP meds for symptoms. She reports 153/140 a couple of weeks ago ; 143/90 last week. She monitors at home. Currently not being treated but was treated about 4 years ago before she was aware she had colon cancer.    Anxiety    Does not want to be treated    Subjective    Discussed the use of AI scribe software for clinical note transcription with the patient, who gave verbal consent to proceed.  History of Present Illness   Gina Kidd is a 63 year old female with hypertension who presents with elevated blood pressure readings.  She has experienced elevated blood pressure readings at home, with recent measurements of 143/90 mmHg and 154/104 mmHg. She has not been taking her previous blood pressure medication, losartan  HCTZ, for about a few years since her diagnosis of colon cancer. She recalls taking the medication only once during her hospital stay for colon cancer and reports no issues with the medication at that time.  Her diet includes a high intake of salty foods, particularly chips, which may have contributed to her weight gain. She drinks zero sugar soda and is aware of the sodium content in her diet.  She has been taking vitamin D  supplements regularly since her blood work in July showed low vitamin D  levels. Her husband purchased the vitamin D  pills, and she has been compliant with the supplementation.  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 She expresses concern about catching the flu due to her work environment.  Lab Results  Component Value Date   NA 140 03/30/2024   CL 105 03/30/2024   K 4.4 03/30/2024   CO2 21 03/30/2024   BUN 12 03/30/2024   CREATININE 0.82 03/30/2024   EGFR 80 03/30/2024   CALCIUM 9.4 03/30/2024   ALBUMIN 4.4 03/30/2024   GLUCOSE 93 03/30/2024        Medications: Outpatient Medications Prior to Visit  Medication Sig   Cholecalciferol 50 MCG (2000 UT) CAPS Take 2,000 Units by mouth daily.   No facility-administered medications prior to visit.   Review of Systems  Constitutional:  Negative for appetite change, chills, fatigue and fever.  Respiratory:  Negative for chest tightness and shortness of breath.   Cardiovascular:  Negative for chest pain and palpitations.  Gastrointestinal:  Negative for abdominal pain, nausea and vomiting.  Neurological:  Negative for dizziness and weakness.       Objective    BP (!) 147/99   Pulse 72   Ht 5' (1.524 m)   Wt 137 lb 8 oz (62.4 kg)   SpO2 100%   BMI 26.85 kg/m   Physical Exam   General appearance: Well developed, well nourished female, cooperative and in no acute distress Head: Normocephalic, without obvious abnormality, atraumatic Respiratory: Respirations even and unlabored, normal respiratory rate Extremities: All extremities are intact.  Skin: Skin color, texture, turgor normal. No rashes seen  Psych: Appropriate  mood and affect. Neurologic: Mental status: Alert, oriented to person, place, and time, thought content appropriate.    Assessment & Plan       Essential hypertension Blood pressure elevated with recent readings of 143/90 mmHg and 154/104 mmHg. High sodium intake noted. Losartan  HCTZ previously effective and well tolerated. - Restart losartan  HCTZ once daily. - Advise reducing sodium intake by cutting back on chips and choosing lightly salted options. - Recommend alternative snacks such as lightly salted  peanuts or mixed nuts. - Schedule follow-up appointment in the first week of October to monitor blood pressure and perform blood work.  Vitamin D  deficiency Vitamin D  levels low in July. Continued supplementation necessary. - Continue vitamin D  supplementation. - Check vitamin D  levels during the follow-up appointment in October.  General Health Maintenance Flu vaccination important due to work in nursing home.    Return in about 3 weeks (around 06/26/2024) for Hypertension.     Nancyann Perry, MD  [facility] [phone] (phone) [phone] (fax)  [facility]

## 2024-06-05 NOTE — Patient Instructions (Signed)
 Gina Kidd  Please review the attached list of medications and notify my office if there are any errors.   . Please bring all of your medications to every appointment so we can make sure that our medication list is the same as yours.

## 2024-06-26 ENCOUNTER — Ambulatory Visit (INDEPENDENT_AMBULATORY_CARE_PROVIDER_SITE_OTHER): Payer: PRIVATE HEALTH INSURANCE | Admitting: Family Medicine

## 2024-06-26 VITALS — BP 127/88 | HR 72 | Ht 60.0 in | Wt 136.9 lb

## 2024-06-26 DIAGNOSIS — E559 Vitamin D deficiency, unspecified: Secondary | ICD-10-CM

## 2024-06-26 DIAGNOSIS — I10 Essential (primary) hypertension: Secondary | ICD-10-CM

## 2024-06-26 NOTE — Progress Notes (Signed)
      Established patient visit   Patient: Gina Kidd   DOB: Jan 02, 1961   63 y.o. Female  MRN: [id number] Visit Date: 06/26/2024  Today's healthcare provider: Nancyann Perry, MD   Chief Complaint  Patient presents with   Medical Management of Chronic Issues   Hypertension   Subjective    Discussed the use of AI scribe software for clinical note transcription with the patient, who gave verbal consent to proceed.  History of Present Illness   Gina Kidd is a 63 year old female who presents for blood pressure follow-up.  She was restarted on losartan  HCTZ 50/12.5 mg daily since her last visit on June 05, 2024. She has experienced no issues with the medication. She has not checked her blood pressure at home recently but is pleased with the current readings.  She is following up on vitamin D  deficiency. Her vitamin D  level was 20.4 ng/mL on March 30, 2024. She is taking vitamin D3 supplements at home, though she mentions taking 5000 mg, which may be a misunderstanding of the dosage.  She mentions dietary restrictions due to gastrointestinal symptoms, stating 'it makes me run to the bathroom' and that she must 'watch what I eat.' She also goes to the gym regularly on her days off.  She has not visited her cancer doctor since her insurance changed and is waiting for open enrollment to switch back to [employer].     Lab Results  Component Value Date   NA 140 03/30/2024   K 4.4 03/30/2024   CREATININE 0.82 03/30/2024   EGFR 80 03/30/2024   GLUCOSE 93 03/30/2024     Medications: Outpatient Medications Prior to Visit  Medication Sig   Cholecalciferol 50 MCG (2000 UT) CAPS Take 2,000 Units by mouth daily. (Patient taking differently: Take 2,000 Units by mouth daily. 5000 iu)   losartan -hydrochlorothiazide (HYZAAR) 50-12.5 MG tablet Take 1 tablet by mouth daily.   No facility-administered medications prior to visit.   Review of Systems  Constitutional:  Negative  for appetite change, chills, fatigue and fever.  Respiratory:  Negative for chest tightness and shortness of breath.   Cardiovascular:  Negative for chest pain and palpitations.  Gastrointestinal:  Negative for abdominal pain, nausea and vomiting.  Neurological:  Negative for dizziness and weakness.       Objective    BP 127/88 (BP Location: Left Arm, Patient Position: Sitting)   Pulse 72   Ht 5' (1.524 m)   Wt 136 lb 14.4 oz (62.1 kg)   SpO2 97%   BMI 26.74 kg/m   Physical Exam   General appearance: Well developed, well nourished female, cooperative and in no acute distress Head: Normocephalic, without obvious abnormality, atraumatic Respiratory: Respirations even and unlabored, normal respiratory rate Extremities: All extremities are intact.  Skin: Skin color, texture, turgor normal. No rashes seen  Psych: Appropriate mood and affect. Neurologic: Mental status: Alert, oriented to person, place, and time, thought content appropriate.    Assessment & Plan    1. Primary hypertension (Primary) Much better since starting back on losartan -hydrochlorothiazide.  - Renal function panel  Refill 90 days supply if labs normal.   2. Vitamin D  deficiency Now taking vitamin d  supplements consistently.   - VITAMIN D  25 Hydroxy (Vit-D Deficiency, Fractures) Return in about 9 months (around 03/30/2025) for Yearly Physical.     Nancyann Perry, MD  [facility] [phone] (phone) [phone] (fax)  [facility]

## 2024-06-26 NOTE — Patient Instructions (Signed)
 Gina Kidd  Please review the attached list of medications and notify my office if there are any errors.   . Please bring all of your medications to every appointment so we can make sure that our medication list is the same as yours.

## 2024-06-27 LAB — RENAL FUNCTION PANEL
Albumin: 4.2 g/dL (ref 3.9–4.9)
BUN/Creatinine Ratio: 11 — ABNORMAL LOW (ref 12–28)
BUN: 9 mg/dL (ref 8–27)
CO2: 25 mmol/L (ref 20–29)
Calcium: 9.4 mg/dL (ref 8.7–10.3)
Chloride: 101 mmol/L (ref 96–106)
Creatinine, Ser: 0.83 mg/dL (ref 0.57–1.00)
Glucose: 108 mg/dL — ABNORMAL HIGH (ref 70–99)
Phosphorus: 3.3 mg/dL (ref 3.0–4.3)
Potassium: 4.1 mmol/L (ref 3.5–5.2)
Sodium: 138 mmol/L (ref 134–144)
eGFR: 79 mL/min/1.73 (ref >59)

## 2024-06-27 LAB — VITAMIN D 25 HYDROXY (VIT D DEFICIENCY, FRACTURES): Vit D, 25-Hydroxy: 33.5 ng/mL (ref 30.0–100.0)

## 2024-06-28 ENCOUNTER — Ambulatory Visit: Payer: Self-pay | Admitting: Family Medicine

## 2024-06-28 MED ORDER — LOSARTAN POTASSIUM-HCTZ 50-12.5 MG PO TABS: 1.0000 | ORAL_TABLET | Freq: Every day | ORAL | 3 refills | Status: AC
